# Patient Record
Sex: Female | Born: 1935 | Race: White | Hispanic: No | Smoking: Former smoker
Health system: Southern US, Community
[De-identification: ages and names within clinical notes are randomized; demographics above are authoritative.]

## PROBLEM LIST (undated history)

## (undated) DIAGNOSIS — M1990 Unspecified osteoarthritis, unspecified site: Secondary | ICD-10-CM

## (undated) DIAGNOSIS — E039 Hypothyroidism, unspecified: Secondary | ICD-10-CM

## (undated) DIAGNOSIS — G629 Polyneuropathy, unspecified: Secondary | ICD-10-CM

## (undated) DIAGNOSIS — M797 Fibromyalgia: Secondary | ICD-10-CM

## (undated) DIAGNOSIS — J40 Bronchitis, not specified as acute or chronic: Secondary | ICD-10-CM

## (undated) DIAGNOSIS — I10 Essential (primary) hypertension: Secondary | ICD-10-CM

## (undated) DIAGNOSIS — J449 Chronic obstructive pulmonary disease, unspecified: Secondary | ICD-10-CM

## (undated) DIAGNOSIS — B029 Zoster without complications: Secondary | ICD-10-CM

## (undated) HISTORY — PX: TONSILLECTOMY: SUR1361

## (undated) HISTORY — PX: TOTAL HIP ARTHROPLASTY: SHX124

## (undated) HISTORY — PX: BACK SURGERY: SHX140

## (undated) HISTORY — PX: NECK SURGERY: SHX720

## (undated) HISTORY — PX: PARTIAL HYSTERECTOMY: SHX80

---

## 2019-11-07 ENCOUNTER — Other Ambulatory Visit: Payer: Self-pay

## 2019-11-07 ENCOUNTER — Encounter (HOSPITAL_BASED_OUTPATIENT_CLINIC_OR_DEPARTMENT_OTHER): Payer: Self-pay | Admitting: Emergency Medicine

## 2019-11-07 ENCOUNTER — Inpatient Hospital Stay (HOSPITAL_BASED_OUTPATIENT_CLINIC_OR_DEPARTMENT_OTHER)
Admission: EM | Admit: 2019-11-07 | Discharge: 2019-11-10 | DRG: 291 | Disposition: A | Discharge status: Home Health | Payer: Medicare Other | Attending: Student | Admitting: Student

## 2019-11-07 ENCOUNTER — Emergency Department (HOSPITAL_BASED_OUTPATIENT_CLINIC_OR_DEPARTMENT_OTHER): Payer: Medicare Other

## 2019-11-07 DIAGNOSIS — R0601 Orthopnea: Secondary | ICD-10-CM

## 2019-11-07 DIAGNOSIS — R161 Splenomegaly, not elsewhere classified: Secondary | ICD-10-CM | POA: Diagnosis present

## 2019-11-07 DIAGNOSIS — Z87891 Personal history of nicotine dependence: Secondary | ICD-10-CM

## 2019-11-07 DIAGNOSIS — J9601 Acute respiratory failure with hypoxia: Secondary | ICD-10-CM | POA: Diagnosis present

## 2019-11-07 DIAGNOSIS — R0602 Shortness of breath: Secondary | ICD-10-CM | POA: Diagnosis not present

## 2019-11-07 DIAGNOSIS — Z96649 Presence of unspecified artificial hip joint: Secondary | ICD-10-CM | POA: Diagnosis present

## 2019-11-07 DIAGNOSIS — R600 Localized edema: Secondary | ICD-10-CM

## 2019-11-07 DIAGNOSIS — Z79891 Long term (current) use of opiate analgesic: Secondary | ICD-10-CM

## 2019-11-07 DIAGNOSIS — M1990 Unspecified osteoarthritis, unspecified site: Secondary | ICD-10-CM | POA: Diagnosis present

## 2019-11-07 DIAGNOSIS — R05 Cough: Secondary | ICD-10-CM

## 2019-11-07 DIAGNOSIS — I517 Cardiomegaly: Secondary | ICD-10-CM | POA: Diagnosis present

## 2019-11-07 DIAGNOSIS — G629 Polyneuropathy, unspecified: Secondary | ICD-10-CM | POA: Diagnosis present

## 2019-11-07 DIAGNOSIS — Z6841 Body Mass Index (BMI) 40.0 and over, adult: Secondary | ICD-10-CM

## 2019-11-07 DIAGNOSIS — M797 Fibromyalgia: Secondary | ICD-10-CM | POA: Diagnosis present

## 2019-11-07 DIAGNOSIS — I480 Paroxysmal atrial fibrillation: Secondary | ICD-10-CM | POA: Diagnosis present

## 2019-11-07 DIAGNOSIS — I5033 Acute on chronic diastolic (congestive) heart failure: Secondary | ICD-10-CM | POA: Diagnosis present

## 2019-11-07 DIAGNOSIS — M7989 Other specified soft tissue disorders: Secondary | ICD-10-CM | POA: Diagnosis present

## 2019-11-07 DIAGNOSIS — F329 Major depressive disorder, single episode, unspecified: Secondary | ICD-10-CM | POA: Diagnosis present

## 2019-11-07 DIAGNOSIS — R059 Cough, unspecified: Secondary | ICD-10-CM

## 2019-11-07 DIAGNOSIS — I110 Hypertensive heart disease with heart failure: Principal | ICD-10-CM | POA: Diagnosis present

## 2019-11-07 DIAGNOSIS — Z90711 Acquired absence of uterus with remaining cervical stump: Secondary | ICD-10-CM

## 2019-11-07 DIAGNOSIS — J441 Chronic obstructive pulmonary disease with (acute) exacerbation: Secondary | ICD-10-CM | POA: Diagnosis present

## 2019-11-07 DIAGNOSIS — R590 Localized enlarged lymph nodes: Secondary | ICD-10-CM | POA: Diagnosis present

## 2019-11-07 DIAGNOSIS — Z79890 Hormone replacement therapy (postmenopausal): Secondary | ICD-10-CM

## 2019-11-07 DIAGNOSIS — R609 Edema, unspecified: Secondary | ICD-10-CM

## 2019-11-07 DIAGNOSIS — N289 Disorder of kidney and ureter, unspecified: Secondary | ICD-10-CM

## 2019-11-07 DIAGNOSIS — Z79899 Other long term (current) drug therapy: Secondary | ICD-10-CM

## 2019-11-07 DIAGNOSIS — Z888 Allergy status to other drugs, medicaments and biological substances status: Secondary | ICD-10-CM

## 2019-11-07 DIAGNOSIS — F419 Anxiety disorder, unspecified: Secondary | ICD-10-CM | POA: Diagnosis present

## 2019-11-07 DIAGNOSIS — Z993 Dependence on wheelchair: Secondary | ICD-10-CM

## 2019-11-07 DIAGNOSIS — I4891 Unspecified atrial fibrillation: Secondary | ICD-10-CM

## 2019-11-07 DIAGNOSIS — L89312 Pressure ulcer of right buttock, stage 2: Secondary | ICD-10-CM | POA: Diagnosis present

## 2019-11-07 DIAGNOSIS — R5381 Other malaise: Secondary | ICD-10-CM | POA: Diagnosis present

## 2019-11-07 DIAGNOSIS — E039 Hypothyroidism, unspecified: Secondary | ICD-10-CM | POA: Diagnosis present

## 2019-11-07 DIAGNOSIS — L8990 Pressure ulcer of unspecified site, unspecified stage: Secondary | ICD-10-CM | POA: Diagnosis present

## 2019-11-07 DIAGNOSIS — D7389 Other diseases of spleen: Secondary | ICD-10-CM | POA: Diagnosis present

## 2019-11-07 DIAGNOSIS — R0902 Hypoxemia: Secondary | ICD-10-CM

## 2019-11-07 DIAGNOSIS — Z20822 Contact with and (suspected) exposure to covid-19: Secondary | ICD-10-CM | POA: Diagnosis present

## 2019-11-07 HISTORY — DX: Essential (primary) hypertension: I10

## 2019-11-07 HISTORY — DX: Unspecified osteoarthritis, unspecified site: M19.90

## 2019-11-07 HISTORY — DX: Polyneuropathy, unspecified: G62.9

## 2019-11-07 HISTORY — DX: Chronic obstructive pulmonary disease, unspecified: J44.9

## 2019-11-07 HISTORY — DX: Zoster without complications: B02.9

## 2019-11-07 HISTORY — DX: Fibromyalgia: M79.7

## 2019-11-07 HISTORY — DX: Bronchitis, not specified as acute or chronic: J40

## 2019-11-07 HISTORY — DX: Hypothyroidism, unspecified: E03.9

## 2019-11-07 LAB — COMPREHENSIVE METABOLIC PANEL
ALT: 15 U/L (ref 0–44)
AST: 18 U/L (ref 15–41)
Albumin: 3.5 g/dL (ref 3.5–5.0)
Alkaline Phosphatase: 49 U/L (ref 38–126)
Anion gap: 8 (ref 5–15)
BUN: 29 mg/dL — ABNORMAL HIGH (ref 8–23)
CO2: 25 mmol/L (ref 22–32)
Calcium: 8.7 mg/dL — ABNORMAL LOW (ref 8.9–10.3)
Chloride: 104 mmol/L (ref 98–111)
Creatinine, Ser: 1.22 mg/dL — ABNORMAL HIGH (ref 0.44–1.00)
GFR calc Af Amer: 46 mL/min — ABNORMAL LOW (ref >60)
GFR calc non Af Amer: 40 mL/min — ABNORMAL LOW (ref >60)
Glucose, Bld: 93 mg/dL (ref 70–99)
Potassium: 4.4 mmol/L (ref 3.5–5.1)
Sodium: 137 mmol/L (ref 135–145)
Total Bilirubin: 0.8 mg/dL (ref 0.3–1.2)
Total Protein: 7 g/dL (ref 6.5–8.1)

## 2019-11-07 LAB — CBC WITH DIFFERENTIAL/PLATELET
Abs Immature Granulocytes: 0.04 10*3/uL (ref 0.00–0.07)
Basophils Absolute: 0.1 10*3/uL (ref 0.0–0.1)
Basophils Relative: 1 %
Eosinophils Absolute: 0.3 10*3/uL (ref 0.0–0.5)
Eosinophils Relative: 3 %
HCT: 41.2 % (ref 36.0–46.0)
Hemoglobin: 13.3 g/dL (ref 12.0–15.0)
Immature Granulocytes: 1 %
Lymphocytes Relative: 18 %
Lymphs Abs: 1.4 10*3/uL (ref 0.7–4.0)
MCH: 29 pg (ref 26.0–34.0)
MCHC: 32.3 g/dL (ref 30.0–36.0)
MCV: 90 fL (ref 80.0–100.0)
Monocytes Absolute: 0.5 10*3/uL (ref 0.1–1.0)
Monocytes Relative: 7 %
Neutro Abs: 5.6 10*3/uL (ref 1.7–7.7)
Neutrophils Relative %: 70 %
Platelets: 217 10*3/uL (ref 150–400)
RBC: 4.58 MIL/uL (ref 3.87–5.11)
RDW: 12.8 % (ref 11.5–15.5)
WBC: 7.9 10*3/uL (ref 4.0–10.5)
nRBC: 0 % (ref 0.0–0.2)

## 2019-11-07 MED ORDER — ALBUTEROL SULFATE HFA 108 (90 BASE) MCG/ACT IN AERS
2.0000 | INHALATION_SPRAY | Freq: Once | RESPIRATORY_TRACT | Status: AC
  Administered 2019-11-07: 2 via RESPIRATORY_TRACT
  Filled 2019-11-07: qty 6.7

## 2019-11-07 MED ORDER — METHYLPREDNISOLONE SODIUM SUCC 125 MG IJ SOLR
125.0000 mg | Freq: Once | INTRAMUSCULAR | Status: AC
  Administered 2019-11-07: 125 mg via INTRAVENOUS
  Filled 2019-11-07: qty 2

## 2019-11-07 MED ORDER — IPRATROPIUM BROMIDE HFA 17 MCG/ACT IN AERS
2.0000 | INHALATION_SPRAY | Freq: Once | RESPIRATORY_TRACT | Status: AC
  Administered 2019-11-07: 2 via RESPIRATORY_TRACT
  Filled 2019-11-07: qty 12.9

## 2019-11-07 NOTE — ED Triage Notes (Signed)
PT brought in by [facility] with c/o hypoxia and shortness of breath  Pt was diagnosed with bronchitis and was prescribed a z pack that she completed today  Pt was having shortness of breath with pursed lip breathing at home with an O2 sat of 84% on room air  EMS gave one albuterol treatment and applied a NRBM at 6 liters  Sat came up to 98% EMS reports that her lungs are clear  Pt lives at home with her daughter

## 2019-11-07 NOTE — Progress Notes (Signed)
Patient SPO2 was on a non rebreather at 6 liters with a SPO2 of 100% upon arrival to ED.  Placed patient on 2 liter nasal cannula.  Patient's SPO2 remains between 97% and 98%.
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
## 2019-11-08 ENCOUNTER — Inpatient Hospital Stay (HOSPITAL_COMMUNITY): Payer: Medicare Other

## 2019-11-08 ENCOUNTER — Encounter (HOSPITAL_COMMUNITY): Payer: Self-pay | Admitting: Internal Medicine

## 2019-11-08 ENCOUNTER — Emergency Department (HOSPITAL_BASED_OUTPATIENT_CLINIC_OR_DEPARTMENT_OTHER): Payer: Medicare Other

## 2019-11-08 DIAGNOSIS — I5031 Acute diastolic (congestive) heart failure: Secondary | ICD-10-CM | POA: Diagnosis not present

## 2019-11-08 DIAGNOSIS — D7389 Other diseases of spleen: Secondary | ICD-10-CM | POA: Diagnosis present

## 2019-11-08 DIAGNOSIS — Z96649 Presence of unspecified artificial hip joint: Secondary | ICD-10-CM | POA: Diagnosis present

## 2019-11-08 DIAGNOSIS — L8990 Pressure ulcer of unspecified site, unspecified stage: Secondary | ICD-10-CM

## 2019-11-08 DIAGNOSIS — R590 Localized enlarged lymph nodes: Secondary | ICD-10-CM | POA: Diagnosis present

## 2019-11-08 DIAGNOSIS — R5381 Other malaise: Secondary | ICD-10-CM | POA: Diagnosis present

## 2019-11-08 DIAGNOSIS — J441 Chronic obstructive pulmonary disease with (acute) exacerbation: Secondary | ICD-10-CM | POA: Diagnosis present

## 2019-11-08 DIAGNOSIS — L89312 Pressure ulcer of right buttock, stage 2: Secondary | ICD-10-CM | POA: Diagnosis present

## 2019-11-08 DIAGNOSIS — I5033 Acute on chronic diastolic (congestive) heart failure: Secondary | ICD-10-CM

## 2019-11-08 DIAGNOSIS — I480 Paroxysmal atrial fibrillation: Secondary | ICD-10-CM | POA: Diagnosis present

## 2019-11-08 DIAGNOSIS — Z993 Dependence on wheelchair: Secondary | ICD-10-CM

## 2019-11-08 DIAGNOSIS — N289 Disorder of kidney and ureter, unspecified: Secondary | ICD-10-CM | POA: Diagnosis present

## 2019-11-08 DIAGNOSIS — I517 Cardiomegaly: Secondary | ICD-10-CM | POA: Diagnosis not present

## 2019-11-08 DIAGNOSIS — M7989 Other specified soft tissue disorders: Secondary | ICD-10-CM | POA: Diagnosis not present

## 2019-11-08 DIAGNOSIS — I10 Essential (primary) hypertension: Secondary | ICD-10-CM | POA: Diagnosis not present

## 2019-11-08 DIAGNOSIS — F329 Major depressive disorder, single episode, unspecified: Secondary | ICD-10-CM | POA: Diagnosis present

## 2019-11-08 DIAGNOSIS — Z888 Allergy status to other drugs, medicaments and biological substances status: Secondary | ICD-10-CM | POA: Diagnosis not present

## 2019-11-08 DIAGNOSIS — F419 Anxiety disorder, unspecified: Secondary | ICD-10-CM | POA: Diagnosis present

## 2019-11-08 DIAGNOSIS — M797 Fibromyalgia: Secondary | ICD-10-CM | POA: Diagnosis present

## 2019-11-08 DIAGNOSIS — Z20822 Contact with and (suspected) exposure to covid-19: Secondary | ICD-10-CM | POA: Diagnosis present

## 2019-11-08 DIAGNOSIS — I4891 Unspecified atrial fibrillation: Secondary | ICD-10-CM | POA: Diagnosis not present

## 2019-11-08 DIAGNOSIS — J9601 Acute respiratory failure with hypoxia: Secondary | ICD-10-CM | POA: Diagnosis present

## 2019-11-08 DIAGNOSIS — G629 Polyneuropathy, unspecified: Secondary | ICD-10-CM | POA: Diagnosis present

## 2019-11-08 DIAGNOSIS — E039 Hypothyroidism, unspecified: Secondary | ICD-10-CM | POA: Diagnosis present

## 2019-11-08 DIAGNOSIS — R0602 Shortness of breath: Secondary | ICD-10-CM | POA: Diagnosis present

## 2019-11-08 DIAGNOSIS — M1990 Unspecified osteoarthritis, unspecified site: Secondary | ICD-10-CM | POA: Diagnosis present

## 2019-11-08 DIAGNOSIS — Z90711 Acquired absence of uterus with remaining cervical stump: Secondary | ICD-10-CM | POA: Diagnosis not present

## 2019-11-08 DIAGNOSIS — Z87891 Personal history of nicotine dependence: Secondary | ICD-10-CM | POA: Diagnosis not present

## 2019-11-08 DIAGNOSIS — R161 Splenomegaly, not elsewhere classified: Secondary | ICD-10-CM | POA: Diagnosis present

## 2019-11-08 DIAGNOSIS — I110 Hypertensive heart disease with heart failure: Secondary | ICD-10-CM | POA: Diagnosis present

## 2019-11-08 DIAGNOSIS — Z6841 Body Mass Index (BMI) 40.0 and over, adult: Secondary | ICD-10-CM | POA: Diagnosis not present

## 2019-11-08 LAB — ECHOCARDIOGRAM COMPLETE: Weight: 4000 oz

## 2019-11-08 LAB — MAGNESIUM: Magnesium: 2 mg/dL (ref 1.7–2.4)

## 2019-11-08 LAB — BRAIN NATRIURETIC PEPTIDE: B Natriuretic Peptide: 81.1 pg/mL (ref 0.0–100.0)

## 2019-11-08 LAB — GLUCOSE, CAPILLARY: Glucose-Capillary: 142 mg/dL — ABNORMAL HIGH (ref 70–99)

## 2019-11-08 LAB — TROPONIN I (HIGH SENSITIVITY)
Troponin I (High Sensitivity): 9 ng/L (ref <18)
Troponin I (High Sensitivity): 12 ng/L (ref <18)

## 2019-11-08 LAB — TSH: TSH: 3.268 u[IU]/mL (ref 0.350–4.500)

## 2019-11-08 LAB — D-DIMER, QUANTITATIVE: D-Dimer, Quant: 1.9 ug/mL-FEU — ABNORMAL HIGH (ref 0.00–0.50)

## 2019-11-08 LAB — RESPIRATORY PANEL BY RT PCR (FLU A&B, COVID)
Influenza A by PCR: NEGATIVE
Influenza B by PCR: NEGATIVE
SARS Coronavirus 2 by RT PCR: NEGATIVE

## 2019-11-08 LAB — LACTIC ACID, PLASMA: Lactic Acid, Venous: 1 mmol/L (ref 0.5–1.9)

## 2019-11-08 LAB — PROCALCITONIN: Procalcitonin: <0.1 ng/mL

## 2019-11-08 MED ORDER — ACETAMINOPHEN 325 MG PO TABS
650.0000 mg | ORAL_TABLET | ORAL | Status: DC | PRN
  Administered 2019-11-10: 650 mg via ORAL
  Filled 2019-11-08: qty 2

## 2019-11-08 MED ORDER — CALCIUM CARBONATE-VITAMIN D 500-200 MG-UNIT PO TABS
1.0000 | ORAL_TABLET | Freq: Every day | ORAL | Status: DC
  Administered 2019-11-08 – 2019-11-10 (×3): 1 via ORAL
  Filled 2019-11-08 (×3): qty 1

## 2019-11-08 MED ORDER — ONDANSETRON HCL 4 MG/2ML IJ SOLN: 4.0000 mg | Freq: Four times a day (QID) | INTRAMUSCULAR | Status: DC | PRN

## 2019-11-08 MED ORDER — PANTOPRAZOLE SODIUM 40 MG PO TBEC
40.0000 mg | DELAYED_RELEASE_TABLET | Freq: Every day | ORAL | Status: DC
  Administered 2019-11-08 – 2019-11-10 (×3): 40 mg via ORAL
  Filled 2019-11-08 (×3): qty 1

## 2019-11-08 MED ORDER — IPRATROPIUM BROMIDE 0.02 % IN SOLN
0.5000 mg | Freq: Three times a day (TID) | RESPIRATORY_TRACT | Status: DC
  Administered 2019-11-08 – 2019-11-10 (×6): 0.5 mg via RESPIRATORY_TRACT
  Filled 2019-11-08 (×6): qty 2.5

## 2019-11-08 MED ORDER — GABAPENTIN 300 MG PO CAPS
300.0000 mg | ORAL_CAPSULE | Freq: Three times a day (TID) | ORAL | Status: DC
  Administered 2019-11-08 – 2019-11-10 (×6): 300 mg via ORAL
  Filled 2019-11-08 (×6): qty 3

## 2019-11-08 MED ORDER — CITALOPRAM HYDROBROMIDE 20 MG PO TABS
40.0000 mg | ORAL_TABLET | Freq: Every day | ORAL | Status: DC
  Administered 2019-11-08 – 2019-11-10 (×3): 40 mg via ORAL
  Filled 2019-11-08 (×3): qty 2

## 2019-11-08 MED ORDER — LORATADINE 10 MG PO TABS
10.0000 mg | ORAL_TABLET | Freq: Every day | ORAL | Status: DC
  Administered 2019-11-08 – 2019-11-10 (×3): 10 mg via ORAL
  Filled 2019-11-08 (×3): qty 1

## 2019-11-08 MED ORDER — ALBUTEROL SULFATE HFA 108 (90 BASE) MCG/ACT IN AERS
2.0000 | INHALATION_SPRAY | RESPIRATORY_TRACT | Status: DC | PRN
  Filled 2019-11-08: qty 6.7

## 2019-11-08 MED ORDER — ENOXAPARIN SODIUM 120 MG/0.8ML ~~LOC~~ SOLN
1.0000 mg/kg | Freq: Two times a day (BID) | SUBCUTANEOUS | Status: DC
  Administered 2019-11-08 – 2019-11-09 (×3): 115 mg via SUBCUTANEOUS
  Filled 2019-11-08 (×3): qty 0.8

## 2019-11-08 MED ORDER — INFLUENZA VAC A&B SA ADJ QUAD 0.5 ML IM PRSY
0.5000 mL | PREFILLED_SYRINGE | INTRAMUSCULAR | Status: DC
  Filled 2019-11-08: qty 0.5

## 2019-11-08 MED ORDER — TRAMADOL HCL 50 MG PO TABS
50.0000 mg | ORAL_TABLET | Freq: Three times a day (TID) | ORAL | Status: DC | PRN
  Administered 2019-11-08 – 2019-11-10 (×4): 50 mg via ORAL
  Filled 2019-11-08 (×4): qty 1

## 2019-11-08 MED ORDER — PREDNISONE 20 MG PO TABS
40.0000 mg | ORAL_TABLET | Freq: Every day | ORAL | Status: DC
  Administered 2019-11-09 – 2019-11-10 (×2): 40 mg via ORAL
  Filled 2019-11-08 (×2): qty 2

## 2019-11-08 MED ORDER — PNEUMOCOCCAL VAC POLYVALENT 25 MCG/0.5ML IJ INJ
0.5000 mL | INJECTION | INTRAMUSCULAR | Status: DC
  Filled 2019-11-08 (×2): qty 0.5

## 2019-11-08 MED ORDER — DILTIAZEM HCL 60 MG PO TABS
60.0000 mg | ORAL_TABLET | Freq: Three times a day (TID) | ORAL | Status: DC
  Administered 2019-11-08 – 2019-11-09 (×2): 60 mg via ORAL
  Filled 2019-11-08 (×2): qty 1

## 2019-11-08 MED ORDER — LEVOTHYROXINE SODIUM 50 MCG PO TABS
50.0000 ug | ORAL_TABLET | Freq: Every day | ORAL | Status: DC
  Administered 2019-11-09 – 2019-11-10 (×2): 50 ug via ORAL
  Filled 2019-11-08 (×2): qty 1

## 2019-11-08 MED ORDER — IOHEXOL 350 MG/ML SOLN
80.0000 mL | Freq: Once | INTRAVENOUS | Status: AC | PRN
  Administered 2019-11-08: 80 mL via INTRAVENOUS

## 2019-11-08 MED ORDER — MUPIROCIN 2 % EX OINT
1.0000 "application " | TOPICAL_OINTMENT | Freq: Three times a day (TID) | CUTANEOUS | Status: DC
  Administered 2019-11-08 – 2019-11-10 (×5): 1 via TOPICAL
  Filled 2019-11-08 (×2): qty 22

## 2019-11-08 MED ORDER — LEVALBUTEROL HCL 0.63 MG/3ML IN NEBU
0.6300 mg | INHALATION_SOLUTION | Freq: Three times a day (TID) | RESPIRATORY_TRACT | Status: DC
  Administered 2019-11-08 – 2019-11-10 (×6): 0.63 mg via RESPIRATORY_TRACT
  Filled 2019-11-08 (×6): qty 3

## 2019-11-08 MED ORDER — FUROSEMIDE 10 MG/ML IJ SOLN
40.0000 mg | Freq: Two times a day (BID) | INTRAMUSCULAR | Status: DC
  Administered 2019-11-08: 40 mg via INTRAVENOUS
  Filled 2019-11-08: qty 4

## 2019-11-08 MED ORDER — SODIUM CHLORIDE 0.9% FLUSH
3.0000 mL | Freq: Two times a day (BID) | INTRAVENOUS | Status: DC
  Administered 2019-11-08 – 2019-11-10 (×5): 3 mL via INTRAVENOUS

## 2019-11-08 MED ORDER — SODIUM CHLORIDE 0.9 % IV SOLN: 250.0000 mL | INTRAVENOUS | Status: DC | PRN

## 2019-11-08 MED ORDER — GUAIFENESIN ER 600 MG PO TB12
600.0000 mg | ORAL_TABLET | Freq: Two times a day (BID) | ORAL | Status: DC
  Administered 2019-11-08 – 2019-11-10 (×5): 600 mg via ORAL
  Filled 2019-11-08 (×5): qty 1

## 2019-11-08 MED ORDER — ALBUTEROL SULFATE (2.5 MG/3ML) 0.083% IN NEBU
2.5000 mg | INHALATION_SOLUTION | Freq: Four times a day (QID) | RESPIRATORY_TRACT | Status: DC | PRN
  Administered 2019-11-08: 2.5 mg via RESPIRATORY_TRACT
  Filled 2019-11-08: qty 3

## 2019-11-08 MED ORDER — SODIUM CHLORIDE 0.9% FLUSH
3.0000 mL | INTRAVENOUS | Status: DC | PRN
  Administered 2019-11-08: 3 mL via INTRAVENOUS

## 2019-11-08 NOTE — Progress Notes (Signed)
ANTICOAGULATION CONSULT NOTE - Initial Consult  Pharmacy Consult for enoxaparin Indication: atrial fibrillation  Allergies  Allergen Reactions  . Lisinopril Swelling  . Phenobarbital     Patient Measurements: Weight: 250 lb (113.4 kg)  Vital Signs: Temp: 97.9 F (36.6 C) (02/11 0822) Temp Source: Oral (02/11 0822) BP: 143/70 (02/11 0822) Pulse Rate: 86 (02/11 0822)  Labs: Recent Labs    11/07/19 2320 11/08/19 0837  HGB 13.3  --   HCT 41.2  --   PLT 217  --   CREATININE 1.22*  --   TROPONINIHS 9 12    CrCl cannot be calculated (Unknown ideal weight.).  Assessment: 86 yo f presenting with shortness of breath  Found to be in afib - new onset no anticoag pta  Cbc stable  CHA2DS2/VAS Stroke Risk Points  Current as of 29 minutes ago     3 >= 2 Points: High Risk  1 - 1.99 Points: Medium Risk  0 Points: Low Risk    The patient's score has not changed in the past year.: No Change     Details    This score determines the patient's risk of having a stroke if the  patient has atrial fibrillation.       Points Metrics  0 Has Congestive Heart Failure:  No    Current as of 29 minutes ago  0 Has Vascular Disease:  No    Current as of 29 minutes ago  0 Has Hypertension:  No    Current as of 29 minutes ago  2 Age:  60    Current as of 29 minutes ago  0 Has Diabetes:  No    Current as of 29 minutes ago  0 Had Stroke:  No  Had TIA:  No  Had thromboembolism:  No    Current as of 29 minutes ago  1 Female:  Yes    Current as of 29 minutes ago   Plan:  enox 1 mg/kg q12h F/u plans for oral ac  Elmer Sow, PharmD, BCPS, BCCCP Clinical Pharmacist [phone]  Please check AMION for all [facility] numbers  11/08/2019 10:28 AM

## 2019-11-08 NOTE — Plan of Care (Signed)

## 2019-11-08 NOTE — Consult Note (Addendum)
Cardiology Consultation:   Patient ID: Angela Beasley MRN: [id number]; DOB: April 06, 1930  Admit date: 11/07/2019 Date of Consult: 11/08/2019  Primary Care Provider: Karle Plumber, MD Primary Cardiologist: New to [facility]   Patient Profile:   Angela Beasley is a 86 y.o. female with a hx of hypertension, COPD with chronic bronchitis, prior smoker (quit 1986), hypothyroidism, fibromyalgia and wheelchair-bound admitted with acute hypoxic respiratory failure secondary to COPD exacerbation/pulmonary edema who is being seen today for the evaluation of new onset atrial fibrillation at the request of Dr. Katrinka Blazing.  Remote stress test for preoperative clearance was low risk.  History of Present Illness:   Angela Beasley most recently diagnosed with acute bronchitis who completed Z-Pak course over the weekend.  Yesterday family noted hypoxia on pulse ox and wheezing and EMS was called.  EMS noted hypoxic at oxygen saturation of 70s and atrial fibrillation.  D-dimer 1.9.  BNP 81.  High-sensitivity troponin normal.  Creatinine 1.22.  CT angiogram of chest without PE however showed pulmonary edema/possible infection, bilateral pleural effusion, mediastinal and hilar adenopathy with spleen mass.  Given nebulizer and IV steroid.  On Lovenox for afib. Pending echo.   Denies fever, chills or Covid exposure.  No chest pain, shortness of breath, palpitation, dizziness, orthopnea, PND, syncope or lower extremity edema.  Heart Pathway Score:     Past Medical History:  Diagnosis Date  . Arthritis   . Bronchitis   . COPD (chronic obstructive pulmonary disease) (HCC)   . Fibromyalgia   . Hypertension   . Hypothyroidism   . Neuropathy   . Shingles     Past Surgical History:  Procedure Laterality Date  . BACK SURGERY    . CESAREAN SECTION    . NECK SURGERY    . PARTIAL HYSTERECTOMY    . TONSILLECTOMY    . TOTAL HIP ARTHROPLASTY      Inpatient Medications: Scheduled Meds: . Calcium Carb-Cholecalciferol  1  tablet Oral Daily  . citalopram  40 mg Oral Daily  . enoxaparin (LOVENOX) injection  1 mg/kg Subcutaneous Q12H  . furosemide  40 mg Intravenous BID  . gabapentin  300 mg Oral TID  . guaiFENesin  600 mg Oral BID  . [START ON 11/09/2019] influenza vaccine adjuvanted  0.5 mL Intramuscular Tomorrow-1000  . ipratropium  0.5 mg Nebulization TID  . levalbuterol  0.63 mg Nebulization TID  . levothyroxine  50 mcg Oral Daily  . loratadine  10 mg Oral Daily  . mupirocin ointment  1 application Topical TID  . [START ON 11/09/2019] pneumococcal 23 valent vaccine  0.5 mL Intramuscular Tomorrow-1000  . predniSONE  40 mg Oral Q breakfast  . sodium chloride flush  3 mL Intravenous Q12H   Continuous Infusions: . sodium chloride     PRN Meds: sodium chloride, acetaminophen, albuterol, ondansetron (ZOFRAN) IV, sodium chloride flush, traMADol  Allergies:    Allergies  Allergen Reactions  . Lisinopril Swelling  . Phenobarbital     Social History:   Social History   Socioeconomic History  . Marital status: Divorced    Spouse name: Not on file  . Number of children: Not on file  . Years of education: Not on file  . Highest education level: Not on file  Occupational History  . Not on file  Tobacco Use  . Smoking status: Former Games developer  . Smokeless tobacco: Never Used  Substance and Sexual Activity  . Alcohol use: Never  . Drug use: Never  . Sexual activity: Not on  file  Other Topics Concern  . Not on file  Social History Narrative  . Not on file   Social Determinants of Health   Financial Resource Strain:   . Difficulty of Paying Living Expenses: Not on file  Food Insecurity:   . Worried About Programme researcher, broadcasting/film/video in the Last Year: Not on file  . Ran Out of Food in the Last Year: Not on file  Transportation Needs:   . Lack of Transportation (Medical): Not on file  . Lack of Transportation (Non-Medical): Not on file  Physical Activity:   . Days of Exercise per Week: Not on file  .  Minutes of Exercise per Session: Not on file  Stress:   . Feeling of Stress : Not on file  Social Connections:   . Frequency of Communication with Friends and Family: Not on file  . Frequency of Social Gatherings with Friends and Family: Not on file  . Attends Religious Services: Not on file  . Active Member of Clubs or Organizations: Not on file  . Attends Banker Meetings: Not on file  . Marital Status: Not on file  Intimate Partner Violence:   . Fear of Current or Ex-Partner: Not on file  . Emotionally Abused: Not on file  . Physically Abused: Not on file  . Sexually Abused: Not on file    Family History:    history of extra beat to mother  ROS:  Please see the history of present illness.  All other ROS reviewed and negative.     Physical Exam/Data:   Vitals:   11/08/19 0434 11/08/19 0500 11/08/19 0623 11/08/19 0822  BP:  123/88 (!) 141/67 (!) 143/70  Pulse:  (!) 119 (!) 113 86  Resp:  14 20 13   Temp:   98.5 F (36.9 C) 97.9 F (36.6 C)  TempSrc:   Oral Oral  SpO2:  96% 96% 96%  Weight: 113.4 kg      No intake or output data in the 24 hours ending 11/08/19 1117 Last 3 Weights 11/08/2019  Weight (lbs) 250 lb  Weight (kg) 113.399 kg     There is no height or weight on file to calculate BMI.  General:  Well nourished, well developed, in no acute distress HEENT: normal Lymph: no adenopathy Neck: no JVD Endocrine:  No thryomegaly Vascular: No carotid bruits; FA pulses 2+ bilaterally without bruits  Cardiac:  normal S1, S2; irregular tachycardic; no murmur  Lungs: Diminished breath sound with scattered wheezing  abd: soft, nontender, no hepatomegaly  Ext: no edema Musculoskeletal:  No deformities, BUE and BLE strength normal and equal Skin: warm and dry  Neuro:  CNs 2-12 intact, no focal abnormalities noted Psych:  Normal affect   EKG:  The EKG was personally reviewed and demonstrates: Atrial fibrillation at rate of 104 bpm Telemetry:  Telemetry was  personally reviewed and demonstrates: Atrial fibrillation at rate 90-1 120s  Relevant CV Studies:  Pending echocardiogram  Laboratory Data:  High Sensitivity Troponin:   Recent Labs  Lab 11/07/19 2320 11/08/19 0837  TROPONINIHS 9 12     Chemistry Recent Labs  Lab 11/07/19 2320  NA 137  K 4.4  CL 104  CO2 25  GLUCOSE 93  BUN 29*  CREATININE 1.22*  CALCIUM 8.7*  GFRNONAA 40*  GFRAA 46*  ANIONGAP 8    Recent Labs  Lab 11/07/19 2320  PROT 7.0  ALBUMIN 3.5  AST 18  ALT 15  ALKPHOS 49  BILITOT  0.8   Hematology Recent Labs  Lab 11/07/19 2320  WBC 7.9  RBC 4.58  HGB 13.3  HCT 41.2  MCV 90.0  MCH 29.0  MCHC 32.3  RDW 12.8  PLT 217   BNP Recent Labs  Lab 11/07/19 2320  BNP 81.1    DDimer  Recent Labs  Lab 11/07/19 2320  DDIMER 1.90*     Radiology/Studies:  CT Angio Chest PE W and/or Wo Contrast  Result Date: 11/08/2019 CLINICAL DATA:  Shortness of breath, elevated D-dimer EXAM: CT ANGIOGRAPHY CHEST WITH CONTRAST TECHNIQUE: Multidetector CT imaging of the chest was performed using the standard protocol during bolus administration of intravenous contrast. Multiplanar CT image reconstructions and MIPs were obtained to evaluate the vascular anatomy. CONTRAST:  33mL OMNIPAQUE IOHEXOL 350 MG/ML SOLN COMPARISON:  Chest radiograph 11/07/2019 FINDINGS: Cardiovascular: Satisfactory opacification the pulmonary arteries to the segmental level. No pulmonary artery filling defects are identified. Central pulmonary arteries are borderline enlarged but this finding is favored to reflect chronic pulmonary artery hypertension in the absence of filling defects. Normal cardiac size. Coronary artery calcifications are present. Dense calcification of the mitral annulus. No pericardial effusion. Normal physiologic fluid in the pericardial recesses. Major venous structures are unremarkable. Mediastinum/Nodes: There is extensive mediastinal and hilar adenopathy. A larger nodal  conglomerate is seen in the subcarinal space measuring up to 23 mm (4/52). Enlarged 12 mm right hilar node (4/59) as well as several borderline enlarged AP window lymph nodes including a 16 mm node (4/45). No acute abnormality of the trachea. Mild nonspecific thickening of the thoracic esophagus. Lungs/Pleura: There is extensive interlobular septal thickening with some interspersed areas of mosaic attenuation and patchy ground-glass. Few multifocal more consolidative airspace opacities are noted as well. Largest focal consolidation seen in right lung base (4/81) measuring up to 2 cm in size. Small bilateral effusions, right slightly greater than left with adjacent passive atelectasis. Upper Abdomen: Heterogeneous lesion seen in the spleen measuring up to 3.8 cm in size. Some irregular internal calcification is noted. Slight left lobe hypertrophy of the liver. Upper abdomen is otherwise unremarkable. Circumferential body wall edema is noted. Musculoskeletal: Multilevel degenerative changes are present in the imaged portions of the spine. No suspicious chest wall lesions. Review of the MIP images confirms the above findings. IMPRESSION: 1. No evidence of acute pulmonary embolism. 2. Central pulmonary arteries are borderline enlarged but this finding is favored to reflect chronic pulmonary artery hypertension in the absence of filling defects. 3. Extensive interlobular septal thickening with some interspersed areas of mosaic attenuation and patchy ground-glass. Few multifocal more consolidative airspace opacities are noted as well. These findings are favored to represent pulmonary edema, however there are features suggesting superimposed infection as well with a more consolidated 2 cm opacity in the right lung base. Consider 6-8 week follow-up imaging to assess for resolution. 4. Small bilateral pleural effusions, right slightly greater than left. Additional body wall edema may reflect early anasarca. 5. Extensive  mediastinal and hilar adenopathy. Differential could include either reactive or edematous nodes given setting of edema and likely infection however given the splenic mass detailed below, malignancy is not fully excluded. Could also be re-evaluated on follow-up imaging. 6. Heterogeneous lesion in the spleen measuring up to 3.8 cm in size with irregular internal calcification. This finding is nonspecific and could represent a benign lesion such as hemangioma, however other etiologies are not excluded. Consider further evaluation dedicated splenic MRI with contrast on a nonemergent outpatient basis for best imaging quality. This  recommendation follows ACR consensus guidelines: White Paper of the ACR Incidental Findings Committee II on Splenic and Nodal Findings. J Am Coll Radiol 2013;10:789-794. 7. Mild nonspecific thickening of the thoracic esophagus. Correlate for features of esophagitis. 8. Slight left lobe hypertrophy of the liver. Correlate with any history of liver disease or risk factors for cirrhosis. These results were called by telephone at the time of interpretation on 11/08/2019 at 1:26 am to provider [facility] , who verbally acknowledged these results. Electronically Signed   By: Lovena Le M.D.   On: 11/08/2019 01:26   DG Chest Portable 1 View  Result Date: 11/07/2019 CLINICAL DATA:  Shortness of breath cough EXAM: PORTABLE CHEST 1 VIEW COMPARISON:  July 30, 2016 FINDINGS: The heart size and mediastinal contours are unchanged with cardiomegaly. Aortic knob calcifications. Mildly increased interstitial markings are seen throughout both lungs, predominantly at both lung bases. There is prominence of the central pulmonary vasculature. No pleural effusion is seen. No acute osseous abnormality. IMPRESSION: Diffusely increased interstitial opacities, predominantly at both lung bases, likely due to interstitial edema. Mild cardiomegaly Electronically Signed   By: Prudencio Pair M.D.   On: 11/07/2019  23:06   { Assessment and Plan:   1. New onset atrial fibrillation with rapid ventricular rate in setting of COPD exacerbation  -Patient is unaware of arrhythmia.  Asymptomatic.  This could be due to COPD exacerbation.  CHA2DS2-VASc 2 score of 4 for age sex and hypertension.  Currently on Lovenox for anticoagulation (got dose this morning)>> spoke with attending Dr. Tamala Julian who said primary team may start work-up tomorrow for spleen mass.  Changed to Eliquis 5 mg twice daily if no further work-up planned at inpatient. Add Cardizem 60mg  q 8 hours for rate control.  Pending echocardiogram. Normal TSH.   2.  Acute hypoxic respiratory failure secondary to pulmonary edema/COPD exacerbation -CT of chest as above -On nebulizer treatment with IV steroids -Recently completed Z-Pak course -Per primary team - On Lasix>> Will not need long term. BNP normal. On signs of volume overload by exam.   3.  Hypertension -Blood pressure relatively stable -Home amlodipine on hold>>> will switch to Cardizem for rate control  4.  Hypothyroidism -TSH normal  5. Spleen mass/mediasinal/hilar adenopathy - Per primary team  -Change anticoagulation to Eliquis when no further work-up planned  For questions or updates, please contact [place] Please consult [email or web address] for contact info under   Jarrett Soho, PA  11/08/2019 11:17 AM   Patient seen and examined.  Agree with above documentation.  Angela Beasley is an 86 year old female with history of hypertension, COPD, fibromyalgia, hypothyroidism who is admitted with acute hypoxic respiratory failure due to COPD exacerbation/pulmonary edema who we are seeing today for evaluation of atrial fibrillation at the request of Dr. Tamala Julian.  Patient was recently diagnosed with acute bronchitis and started on Z-Pak.  Yesterday she was found to be hypoxic on pulse ox by her family and EMS was called.  On arrival, her oxygen saturations were in the 70s and she was  noted to be in atrial fibrillation.  Labs notable for normal high-sensitivity troponin, BNP 81, creatinine 1.22.  D-dimer elevated at 1.9, CTA chest was done which showed no evidence of PE but did show pulmonary edema/possible infection, as well as spleen mass.  Being treated for COPD exacerbation with IV steroids.  EKG on admission personally reviewed, shows sinus rhythm, rate 76, low voltage.  EKG from today personally reviewed and shows atrial fibrillation, rate 104,  low voltage.  From review of telemetry shows patient was initially in sinus rhythm in the 80s, currently in atrial fibrillation with rates 100s to 110s. On exam, patient is alert and oriented, irregular rate, tachycardic, no murmurs, diminished breath sounds, no LE edema or JVD.  She  denies any palpitations or chest pain.  For her atrial fibrillation, will start on diltiazem 60 mg every 8 hours for rate control.  On therapeutic Lovenox for anticoagulation, will transition to Eliquis if no further invasive procedures planned.  Little Ishikawa, MD

## 2019-11-08 NOTE — Progress Notes (Signed)
Patient's SPO2 is falling below 90%.  Increased patient's from 2 liters to 3 liters on her nasal cannula.  Patient's SPO2 increased to 95%.

## 2019-11-08 NOTE — Progress Notes (Signed)
Bilateral lower extremity venous duplex has been completed. Preliminary results can be found in CV Proc through chart review.   11/08/19 10:54 AM Olen Cordial RVT

## 2019-11-08 NOTE — Progress Notes (Signed)
  Echocardiogram 2D Echocardiogram has been performed.  Angela Beasley 11/08/2019, 12:31 PM

## 2019-11-08 NOTE — ED Provider Notes (Signed)
Nursing notes and vitals signs, including pulse oximetry, reviewed.  Summary of this visit's results, reviewed by myself:  EKG:  EKG Interpretation  Date/Time:  Wednesday November 07 2019 23:56:07 EST Ventricular Rate:  76 PR Interval:    QRS Duration: 93 QT Interval:  379 QTC Calculation: 427 R Axis:   63 Text Interpretation: Sinus rhythm Low voltage, extremity and precordial leads no prior ECG for comparison. No STEMI Confirmed by Antony Blackbird [phone]) on 11/08/2019 12:17:16 AM       Labs:  Results for orders placed or performed during the hospital encounter of 11/07/19 (from the past 24 hour(s))  D-dimer, quantitative (not at [facility])     Status: Abnormal   Collection Time: 11/07/19 11:20 PM  Result Value Ref Range   D-Dimer, Quant 1.90 (H) 0.00 - 0.50 ug/mL-FEU  CBC with Differential     Status: None   Collection Time: 11/07/19 11:20 PM  Result Value Ref Range   WBC 7.9 4.0 - 10.5 K/uL   RBC 4.58 3.87 - 5.11 MIL/uL   Hemoglobin 13.3 12.0 - 15.0 g/dL   HCT 41.2 36.0 - 46.0 %   MCV 90.0 80.0 - 100.0 fL   MCH 29.0 26.0 - 34.0 pg   MCHC 32.3 30.0 - 36.0 g/dL   RDW 12.8 11.5 - 15.5 %   Platelets 217 150 - 400 K/uL   nRBC 0.0 0.0 - 0.2 %   Neutrophils Relative % 70 %   Neutro Abs 5.6 1.7 - 7.7 K/uL   Lymphocytes Relative 18 %   Lymphs Abs 1.4 0.7 - 4.0 K/uL   Monocytes Relative 7 %   Monocytes Absolute 0.5 0.1 - 1.0 K/uL   Eosinophils Relative 3 %   Eosinophils Absolute 0.3 0.0 - 0.5 K/uL   Basophils Relative 1 %   Basophils Absolute 0.1 0.0 - 0.1 K/uL   Immature Granulocytes 1 %   Abs Immature Granulocytes 0.04 0.00 - 0.07 K/uL  Comprehensive metabolic panel     Status: Abnormal   Collection Time: 11/07/19 11:20 PM  Result Value Ref Range   Sodium 137 135 - 145 mmol/L   Potassium 4.4 3.5 - 5.1 mmol/L   Chloride 104 98 - 111 mmol/L   CO2 25 22 - 32 mmol/L   Glucose, Bld 93 70 - 99 mg/dL   BUN 29 (H) 8 - 23 mg/dL   Creatinine, Ser 1.22 (H) 0.44 - 1.00 mg/dL   Calcium 8.7 (L) 8.9 - 10.3 mg/dL   Total Protein 7.0 6.5 - 8.1 g/dL   Albumin 3.5 3.5 - 5.0 g/dL   AST 18 15 - 41 U/L   ALT 15 0 - 44 U/L   Alkaline Phosphatase 49 38 - 126 U/L   Total Bilirubin 0.8 0.3 - 1.2 mg/dL   GFR calc non Af Amer 40 (L) >60 mL/min   GFR calc Af Amer 46 (L) >60 mL/min   Anion gap 8 5 - 15  Brain natriuretic peptide     Status: None   Collection Time: 11/07/19 11:20 PM  Result Value Ref Range   B Natriuretic Peptide 81.1 0.0 - 100.0 pg/mL  Troponin I (High Sensitivity)     Status: None   Collection Time: 11/07/19 11:20 PM  Result Value Ref Range   Troponin I (High Sensitivity) 9 <18 ng/L  Lactic acid, plasma     Status: None   Collection Time: 11/07/19 11:45 PM  Result Value Ref Range   Lactic Acid, Venous 1.0 0.5 -  1.9 mmol/L  Respiratory Panel by RT PCR (Flu A&B, Covid) - Nasopharyngeal Swab     Status: None   Collection Time: 11/08/19  1:00 AM   Specimen: Nasopharyngeal Swab  Result Value Ref Range   SARS Coronavirus 2 by RT PCR NEGATIVE NEGATIVE   Influenza A by PCR NEGATIVE NEGATIVE   Influenza B by PCR NEGATIVE NEGATIVE    Imaging Studies: CT Angio Chest PE W and/or Wo Contrast  Result Date: 11/08/2019 CLINICAL DATA:  Shortness of breath, elevated D-dimer EXAM: CT ANGIOGRAPHY CHEST WITH CONTRAST TECHNIQUE: Multidetector CT imaging of the chest was performed using the standard protocol during bolus administration of intravenous contrast. Multiplanar CT image reconstructions and MIPs were obtained to evaluate the vascular anatomy. CONTRAST:  37mL OMNIPAQUE IOHEXOL 350 MG/ML SOLN COMPARISON:  Chest radiograph 11/07/2019 FINDINGS: Cardiovascular: Satisfactory opacification the pulmonary arteries to the segmental level. No pulmonary artery filling defects are identified. Central pulmonary arteries are borderline enlarged but this finding is favored to reflect chronic pulmonary artery hypertension in the absence of filling defects. Normal cardiac size.  Coronary artery calcifications are present. Dense calcification of the mitral annulus. No pericardial effusion. Normal physiologic fluid in the pericardial recesses. Major venous structures are unremarkable. Mediastinum/Nodes: There is extensive mediastinal and hilar adenopathy. A larger nodal conglomerate is seen in the subcarinal space measuring up to 23 mm (4/52). Enlarged 12 mm right hilar node (4/59) as well as several borderline enlarged AP window lymph nodes including a 16 mm node (4/45). No acute abnormality of the trachea. Mild nonspecific thickening of the thoracic esophagus. Lungs/Pleura: There is extensive interlobular septal thickening with some interspersed areas of mosaic attenuation and patchy ground-glass. Few multifocal more consolidative airspace opacities are noted as well. Largest focal consolidation seen in right lung base (4/81) measuring up to 2 cm in size. Small bilateral effusions, right slightly greater than left with adjacent passive atelectasis. Upper Abdomen: Heterogeneous lesion seen in the spleen measuring up to 3.8 cm in size. Some irregular internal calcification is noted. Slight left lobe hypertrophy of the liver. Upper abdomen is otherwise unremarkable. Circumferential body wall edema is noted. Musculoskeletal: Multilevel degenerative changes are present in the imaged portions of the spine. No suspicious chest wall lesions. Review of the MIP images confirms the above findings. IMPRESSION: 1. No evidence of acute pulmonary embolism. 2. Central pulmonary arteries are borderline enlarged but this finding is favored to reflect chronic pulmonary artery hypertension in the absence of filling defects. 3. Extensive interlobular septal thickening with some interspersed areas of mosaic attenuation and patchy ground-glass. Few multifocal more consolidative airspace opacities are noted as well. These findings are favored to represent pulmonary edema, however there are features suggesting  superimposed infection as well with a more consolidated 2 cm opacity in the right lung base. Consider 6-8 week follow-up imaging to assess for resolution. 4. Small bilateral pleural effusions, right slightly greater than left. Additional body wall edema may reflect early anasarca. 5. Extensive mediastinal and hilar adenopathy. Differential could include either reactive or edematous nodes given setting of edema and likely infection however given the splenic mass detailed below, malignancy is not fully excluded. Could also be re-evaluated on follow-up imaging. 6. Heterogeneous lesion in the spleen measuring up to 3.8 cm in size with irregular internal calcification. This finding is nonspecific and could represent a benign lesion such as hemangioma, however other etiologies are not excluded. Consider further evaluation dedicated splenic MRI with contrast on a nonemergent outpatient basis for best imaging quality. This  recommendation follows ACR consensus guidelines: White Paper of the ACR Incidental Findings Committee II on Splenic and Nodal Findings. J Am Coll Radiol 2013;10:789-794. 7. Mild nonspecific thickening of the thoracic esophagus. Correlate for features of esophagitis. 8. Slight left lobe hypertrophy of the liver. Correlate with any history of liver disease or risk factors for cirrhosis. These results were called by telephone at the time of interpretation on 11/08/2019 at 1:26 am to provider [facility] , who verbally acknowledged these results. Electronically Signed   By: Kreg Shropshire M.D.   On: 11/08/2019 01:26   DG Chest Portable 1 View  Result Date: 11/07/2019 CLINICAL DATA:  Shortness of breath cough EXAM: PORTABLE CHEST 1 VIEW COMPARISON:  July 30, 2016 FINDINGS: The heart size and mediastinal contours are unchanged with cardiomegaly. Aortic knob calcifications. Mildly increased interstitial markings are seen throughout both lungs, predominantly at both lung bases. There is prominence of the  central pulmonary vasculature. No pleural effusion is seen. No acute osseous abnormality. IMPRESSION: Diffusely increased interstitial opacities, predominantly at both lung bases, likely due to interstitial edema. Mild cardiomegaly Electronically Signed   By: Jonna Clark M.D.   On: 11/07/2019 23:06   3:35 AM Patient negative for SARS 2 and influenza a and B.  Patient noted to be in atrial fibrillation.  Will have patient admitted.  Patient's oxygen saturation 93% on 3 L by nasal cannula.    EKG Interpretation  Date/Time:  Thursday November 08 2019 03:36:40 EST Ventricular Rate:  104 PR Interval:    QRS Duration: 86 QT Interval:  317 QTC Calculation: 417 R Axis:   94 Text Interpretation: Atrial fibrillation Right axis deviation Low voltage, precordial leads Previously NSR Confirmed by Parul Porcelli ([id number]) on 11/08/2019 3:38:31 AM      3:50 AM Dr. Selena Batten accepts for admission to hospitalist service.    Denver Harder, Jonny Ruiz, MD 11/08/19 [phone]

## 2019-11-08 NOTE — H&P (Signed)
History and Physical    Angela Beasley WCH:[id number] DOB: [birth year] DOA: 11/07/2019  Referring MD/NP/PA: Pearson Grippe, MD PCP: Karle Plumber, MD  Patient coming from: Transfer from [facility]  Chief Complaint: Shortness of breath  I have personally briefly reviewed patient's old medical records in [facility]   HPI: Angela Beasley is a 86 y.o. female with medical history significant of hypertension, hypothyroidism, COPD with chronic bronchitis, fibromyalgia, and arthritis.  She presents with complaints of cough and shortness of breath.  At baseline patient is wheelchair bound and lives at home with family.  She noticed that with any kind of activity of moving her arms she was becoming extremely short of breath.  Complains of having chest congestion, but unable to cough anything up.  Reported associated symptoms of wheezing, orthopnea, and leg swelling.  Does not report any change in her weight and is not on any blood thinners.  O2 saturations as low as 70-80% on room air at home for which EMS was called.  She is not normally on oxygen.  Patient denies any history of irregular heart beat or heart failure.  She had just recently completed a course of azithromycin on 2/9.  In [facility] with [facility] patient was placed on 6 L of nasal cannula oxygen and given a breathing treatment.   ED Course: Patient was noted to be afebrile, heart rates up to 119 in atrial fibrillation, O2 saturations 92 - 98% on 3 L of nasal cannula oxygen, and all other vital signs relatively maintained.  Labs significant for CBC within normal limits, BUN 29, creatinine 1.22, calcium 8.7, BNP 81.1, high-sensitivity troponin negative, and D-dimer 1.9.  CT angiogram of the chest revealed no PE, concern for pulmonary edema/possible superimposed infection, bilateral pleural effusions, extensive mediastinal and hilar adenopathy, heterogeneous lesion in the spleen measuring 3.8 cm, and signs suggestive of esophagitis.  Revealed she  received breathing treatments and 125 mg of Solu-Medrol IV.  Influenza and COVID-19 screening were negative.  TRH called to admit and accepted to a progressive bed at [facility].  Review of Systems  Constitutional: Positive for malaise/fatigue. Negative for fever.  HENT: Negative for ear discharge and nosebleeds.   Eyes: Negative for double vision and photophobia.  Respiratory: Positive for cough, shortness of breath and wheezing. Negative for sputum production.   Cardiovascular: Positive for leg swelling.  Gastrointestinal: Negative for nausea and vomiting.  Genitourinary: Negative for dysuria and hematuria.  Musculoskeletal: Negative for falls.  Neurological: Negative for speech change and focal weakness.  Psychiatric/Behavioral: Negative for substance abuse.    Past Medical History:  Diagnosis Date  . Arthritis   . Bronchitis   . COPD (chronic obstructive pulmonary disease) (HCC)   . Fibromyalgia   . Hypertension   . Hypothyroidism   . Neuropathy   . Shingles     Past Surgical History:  Procedure Laterality Date  . BACK SURGERY    . CESAREAN SECTION    . NECK SURGERY    . PARTIAL HYSTERECTOMY    . TONSILLECTOMY    . TOTAL HIP ARTHROPLASTY       reports that she has quit smoking. She has never used smokeless tobacco. She reports that she does not drink alcohol or use drugs.  Allergies  Allergen Reactions  . Lisinopril   . Phenobarbital     No family history on file.  Prior to Admission medications   Medication Sig Start Date End Date Taking? Authorizing Provider  amLODipine (NORVASC)  10 MG tablet Take 10 mg by mouth daily. 10/31/19   [provider]  citalopram (CELEXA) 40 MG tablet Take 40 mg by mouth daily. 10/31/19   [provider]  diclofenac (VOLTAREN) 75 MG EC tablet Take 75 mg by mouth 2 (two) times daily. 10/31/19   [provider]  gabapentin (NEURONTIN) 300 MG capsule Take 300 mg by mouth 3 (three) times daily. 10/31/19   [provider]  levalbuterol Penne Lash) 0.63 MG/3ML nebulizer solution Take by nebulization 4 (four) times daily. 11/02/19   [provider]  levothyroxine (SYNTHROID) 50 MCG tablet Take 50 mcg by mouth daily. 10/15/19   [provider]  mupirocin ointment (BACTROBAN) 2 % APPLY EXTERNALLY TO THE AFFECTED AREA THREE TIMES DAILY 10/31/19   [provider]  traMADol (ULTRAM) 50 MG tablet Take 50 mg by mouth 3 (three) times daily. 10/31/19   [provider]    Physical Exam:  Constitutional: Obese elderly female currently in NAD, calm, comfortable Vitals:   11/08/19 0330 11/08/19 0434 11/08/19 0500 11/08/19 0623  BP: 138/68  123/88 (!) 141/67  Pulse: 77  (!) 119 (!) 113  Resp: 14  14 20   Temp:    98.5 F (36.9 C)  TempSrc:    Oral  SpO2: 93%  96% 96%  Weight:  113.4 kg     Eyes: PERRL, lids and conjunctivae normal ENMT: Mucous membranes are moist. Posterior pharynx clear of any exudate or lesions.Normal dentition.  Neck: normal, supple, no masses, no thyromegaly Respiratory: Decreased aeration with mild expiratory wheeze appreciated and crackles.  Patient currently on 3 L of nasal cannula oxygen. Cardiovascular: Regular rate and rhythm, no murmurs / rubs / gallops. Trace lower extremity edema. 2+ pedal pulses. No carotid bruits.  Abdomen: no tenderness, no masses palpated. No hepatosplenomegaly. Bowel sounds positive.  Musculoskeletal: no clubbing / cyanosis. No joint deformity upper and lower extremities. Good ROM, no contractures. Normal muscle tone.  Skin: no rashes, lesions, ulcers. No induration Neurologic: CN 2-12 grossly intact. Sensation intact, DTR normal. Strength 5/5 in all 4.  Psychiatric: Normal judgment and insight. Alert and oriented x 3. Normal mood.     Labs on Admission: I have personally reviewed following labs and imaging studies  CBC: Recent Labs  Lab 11/07/19 2320  WBC 7.9  NEUTROABS 5.6  HGB 13.3  HCT 41.2  MCV 90.0  PLT 217     Basic Metabolic Panel: Recent Labs  Lab 11/07/19 2320  NA 137  K 4.4  CL 104  CO2 25  GLUCOSE 93  BUN 29*  CREATININE 1.22*  CALCIUM 8.7*   GFR: CrCl cannot be calculated (Unknown ideal weight.). Liver Function Tests: Recent Labs  Lab 11/07/19 2320  AST 18  ALT 15  ALKPHOS 49  BILITOT 0.8  PROT 7.0  ALBUMIN 3.5   No results for input(s): LIPASE, AMYLASE in the last 168 hours. No results for input(s): AMMONIA in the last 168 hours. Coagulation Profile: No results for input(s): INR, PROTIME in the last 168 hours. Cardiac Enzymes: No results for input(s): CKTOTAL, CKMB, CKMBINDEX, TROPONINI in the last 168 hours. BNP (last 3 results) No results for input(s): PROBNP in the last 8760 hours. HbA1C: No results for input(s): HGBA1C in the last 72 hours. CBG: No results for input(s): GLUCAP in the last 168 hours. Lipid Profile: No results for input(s): CHOL, HDL, LDLCALC, TRIG, CHOLHDL, LDLDIRECT in the last 72 hours. Thyroid Function Tests: No results for input(s): TSH, T4TOTAL, FREET4,  T3FREE, THYROIDAB in the last 72 hours. Anemia Panel: No results for input(s): VITAMINB12, FOLATE, FERRITIN, TIBC, IRON, RETICCTPCT in the last 72 hours. Urine analysis: No results found for: COLORURINE, APPEARANCEUR, LABSPEC, PHURINE, GLUCOSEU, HGBUR, BILIRUBINUR, KETONESUR, PROTEINUR, UROBILINOGEN, NITRITE, LEUKOCYTESUR Sepsis Labs: Recent Results (from the past 240 hour(s))  Respiratory Panel by RT PCR (Flu A&B, Covid) - Nasopharyngeal Swab     Status: None   Collection Time: 11/08/19  1:00 AM   Specimen: Nasopharyngeal Swab  Result Value Ref Range Status   SARS Coronavirus 2 by RT PCR NEGATIVE NEGATIVE Final    Comment: (NOTE) SARS-CoV-2 target nucleic acids are NOT DETECTED. The SARS-CoV-2 RNA is generally detectable in upper respiratoy specimens during the acute phase of infection. The lowest concentration of SARS-CoV-2 viral copies this assay can detect is 131 copies/mL. A  negative result does not preclude SARS-Cov-2 infection and should not be used as the sole basis for treatment or other patient management decisions. A negative result may occur with  improper specimen collection/handling, submission of specimen other than nasopharyngeal swab, presence of viral mutation(s) within the areas targeted by this assay, and inadequate number of viral copies (<131 copies/mL). A negative result must be combined with clinical observations, patient history, and epidemiological information. The expected result is Negative. Fact Sheet for Patients:  https://www.moore.com/ Fact Sheet for Healthcare Providers:  https://www.young.biz/ This test is not yet ap proved or cleared by the [place] FDA and  has been authorized for detection and/or diagnosis of SARS-CoV-2 by FDA under an Emergency Use Authorization (EUA). This EUA will remain  in effect (meaning this test can be used) for the duration of the COVID-19 declaration under Section 564(b)(1) of the Act, 21 U.S.C. section 360bbb-3(b)(1), unless the authorization is terminated or revoked sooner.    Influenza A by PCR NEGATIVE NEGATIVE Final   Influenza B by PCR NEGATIVE NEGATIVE Final    Comment: (NOTE) The Xpert Xpress SARS-CoV-2/FLU/RSV assay is intended as an aid in  the diagnosis of influenza from Nasopharyngeal swab specimens and  should not be used as a sole basis for treatment. Nasal washings and  aspirates are unacceptable for Xpert Xpress SARS-CoV-2/FLU/RSV  testing. Fact Sheet for Patients: https://www.moore.com/ Fact Sheet for Healthcare Providers: https://www.young.biz/ This test is not yet approved or cleared by the [place] FDA and  has been authorized for detection and/or diagnosis of SARS-CoV-2 by  FDA under an Emergency Use Authorization (EUA). This EUA will remain  in effect (meaning this test can be used) for  the duration of the  Covid-19 declaration under Section 564(b)(1) of the Act, 21  U.S.C. section 360bbb-3(b)(1), unless the authorization is  terminated or revoked. Performed at [facility], [address], [place], [place] [address]      Radiological Exams on Admission: CT Angio Chest PE W and/or Wo Contrast  Result Date: 11/08/2019 CLINICAL DATA:  Shortness of breath, elevated D-dimer EXAM: CT ANGIOGRAPHY CHEST WITH CONTRAST TECHNIQUE: Multidetector CT imaging of the chest was performed using the standard protocol during bolus administration of intravenous contrast. Multiplanar CT image reconstructions and MIPs were obtained to evaluate the vascular anatomy. CONTRAST:  109mL OMNIPAQUE IOHEXOL 350 MG/ML SOLN COMPARISON:  Chest radiograph 11/07/2019 FINDINGS: Cardiovascular: Satisfactory opacification the pulmonary arteries to the segmental level. No pulmonary artery filling defects are identified. Central pulmonary arteries are borderline enlarged but this finding is favored to reflect chronic pulmonary artery hypertension in the absence of filling defects. Normal cardiac size. Coronary artery calcifications are  present. Dense calcification of the mitral annulus. No pericardial effusion. Normal physiologic fluid in the pericardial recesses. Major venous structures are unremarkable. Mediastinum/Nodes: There is extensive mediastinal and hilar adenopathy. A larger nodal conglomerate is seen in the subcarinal space measuring up to 23 mm (4/52). Enlarged 12 mm right hilar node (4/59) as well as several borderline enlarged AP window lymph nodes including a 16 mm node (4/45). No acute abnormality of the trachea. Mild nonspecific thickening of the thoracic esophagus. Lungs/Pleura: There is extensive interlobular septal thickening with some interspersed areas of mosaic attenuation and patchy ground-glass. Few multifocal more consolidative airspace opacities are noted as well. Largest focal consolidation  seen in right lung base (4/81) measuring up to 2 cm in size. Small bilateral effusions, right slightly greater than left with adjacent passive atelectasis. Upper Abdomen: Heterogeneous lesion seen in the spleen measuring up to 3.8 cm in size. Some irregular internal calcification is noted. Slight left lobe hypertrophy of the liver. Upper abdomen is otherwise unremarkable. Circumferential body wall edema is noted. Musculoskeletal: Multilevel degenerative changes are present in the imaged portions of the spine. No suspicious chest wall lesions. Review of the MIP images confirms the above findings. IMPRESSION: 1. No evidence of acute pulmonary embolism. 2. Central pulmonary arteries are borderline enlarged but this finding is favored to reflect chronic pulmonary artery hypertension in the absence of filling defects. 3. Extensive interlobular septal thickening with some interspersed areas of mosaic attenuation and patchy ground-glass. Few multifocal more consolidative airspace opacities are noted as well. These findings are favored to represent pulmonary edema, however there are features suggesting superimposed infection as well with a more consolidated 2 cm opacity in the right lung base. Consider 6-8 week follow-up imaging to assess for resolution. 4. Small bilateral pleural effusions, right slightly greater than left. Additional body wall edema may reflect early anasarca. 5. Extensive mediastinal and hilar adenopathy. Differential could include either reactive or edematous nodes given setting of edema and likely infection however given the splenic mass detailed below, malignancy is not fully excluded. Could also be re-evaluated on follow-up imaging. 6. Heterogeneous lesion in the spleen measuring up to 3.8 cm in size with irregular internal calcification. This finding is nonspecific and could represent a benign lesion such as hemangioma, however other etiologies are not excluded. Consider further evaluation dedicated  splenic MRI with contrast on a nonemergent outpatient basis for best imaging quality. This recommendation follows ACR consensus guidelines: White Paper of the ACR Incidental Findings Committee II on Splenic and Nodal Findings. J Am Coll Radiol 2013;10:789-794. 7. Mild nonspecific thickening of the thoracic esophagus. Correlate for features of esophagitis. 8. Slight left lobe hypertrophy of the liver. Correlate with any history of liver disease or risk factors for cirrhosis. These results were called by telephone at the time of interpretation on 11/08/2019 at 1:26 am to provider [facility] , who verbally acknowledged these results. Electronically Signed   By: Kreg Shropshire M.D.   On: 11/08/2019 01:26   DG Chest Portable 1 View  Result Date: 11/07/2019 CLINICAL DATA:  Shortness of breath cough EXAM: PORTABLE CHEST 1 VIEW COMPARISON:  July 30, 2016 FINDINGS: The heart size and mediastinal contours are unchanged with cardiomegaly. Aortic knob calcifications. Mildly increased interstitial markings are seen throughout both lungs, predominantly at both lung bases. There is prominence of the central pulmonary vasculature. No pleural effusion is seen. No acute osseous abnormality. IMPRESSION: Diffusely increased interstitial opacities, predominantly at both lung bases, likely due to interstitial edema. Mild cardiomegaly Electronically  Signed   By: Jonna Clark M.D.   On: 11/07/2019 23:06    EKG: Independently reviewed.  Atrial fibrillation at 104 bpm with right axis deviation  Assessment/Plan Acute respiratory failure with hypoxia secondary to COPD exacerbation: Patient presents with complaints of shortness of breath, cough, and wheezing.  Imaging studies concerning for interstitial edema and with mild cardiomegaly.  Patient had been given breathing treatments and 125 mg of Solu-Medrol IV. -Admit to a progressive bed -Continuous pulse oximetry with nasal cannula oxygen to maintain O2 saturation   -Xopenex and  ipratropium nebs 3 times daily -Prednisone 40 mg daily  Paroxysmal atrial fibrillation: Acute.  Appears to be in new onset atrial fibrillation with heart rates into the 110s, but currently back in sinus rhythm.  CHA2DS2-VASc score equal to at least 4 based off age, sex, and hypertension.  She was not on any form of anticoagulation. -Goal potassium 4 and magnesium  -Lovenox per pharmacy -Check echocardiogram -Cardiology consulted with recommendations as seen below -Start Cardizem 60 mg p.o. every 8 hours per the recommendations -Recommending transitioning to Eliquis,  if no further invasive testing warranted  Cardiomegaly/question congestive heart failure exacerbation: Acute.  Patient presents with complaints of lower extremity swelling and recently worsening shortness of breath.  Chest x-ray noting pulmonary edema and mild cardiomegaly.  BNP 81.1, but suspect this is secondary to patient's weight.  No previous history of heart failure reported. -Strict intake and output -Daily weights -Furosemide 40 mg IV x1 -Reassess and determine if further need of diuresis warranted -Follow-up echocardiogram  Lower extremity swelling: Acute.  Patient found to have elevated D-dimer.  CT angiogram of the chest did not show any signs of a PE, but we will rule out the possibility of DVT.  Suspect symptoms secondary to heart failure. -Check Doppler ultrasound of the lower extremities  Renal insufficiency: Acute.  On admission creatinine noted to be 1.22 with BUN 29.  No previous baseline with which to compare at this time.  Suspect poor perfusion as a cause of patient's kidney injury. -Hold nephrotoxic agent such as diclofenac -Continue to monitor kidney function with diuresis  Hypothyroidism -Check TSH -Continue levothyroxine  wheelchair-bound: Patient noted to have signs of pressure ulcer on admission by nursing staff. -Low air loss mattress replacement  Essential hypertension: Pressures currently  maintained Home medications include amlodipine 10 mg daily. -Hold amlodipine for now  Lesion of the spleen: Incidental finding seen on CT scan. - May warrant further work-up once patient's acute problems resolved.   GI prophylaxis: Protonix DVT prophylaxis: Lovenox Code Status: Full Family Communication: No family present at bedside Disposition Plan: Possible discharge home in 1 to 2 days Consults called: Cardiology Admission status: Inpatient  Clydie Braun MD [facility] Pager [phone]   If 7PM-7AM, please contact night-coverage [email or web address] Password [facility]  11/08/2019, 7:37 AM

## 2019-11-08 NOTE — Plan of Care (Signed)
Xcover 86 yo female with Fibromyalgia, h/o shingles w neuropathy Copd, apparently presents with dyspnea , was hypoxic, pox 84% and has new onset afib   CTA chest ->  IMPRESSION: 1. No evidence of acute pulmonary embolism. 2. Central pulmonary arteries are borderline enlarged but this finding is favored to reflect chronic pulmonary artery hypertension in the absence of filling defects. 3. Extensive interlobular septal thickening with some interspersed areas of mosaic attenuation and patchy ground-glass. Few multifocal more consolidative airspace opacities are noted as well. These findings are favored to represent pulmonary edema, however there are features suggesting superimposed infection as well with a more consolidated 2 cm opacity in the right lung base. Consider 6-8 week follow-up imaging to assess for resolution. 4. Small bilateral pleural effusions, right slightly greater than left. Additional body wall edema may reflect early anasarca. 5. Extensive mediastinal and hilar adenopathy. Differential could include either reactive or edematous nodes given setting of edema and likely infection however given the splenic mass detailed below, malignancy is not fully excluded. Could also be re-evaluated on follow-up imaging. 6. Heterogeneous lesion in the spleen measuring up to 3.8 cm in size with irregular internal calcification. This finding is nonspecific and could represent a benign lesion such as hemangioma, however other etiologies are not excluded. Consider further evaluation dedicated splenic MRI with contrast on a nonemergent outpatient basis for best imaging quality. This recommendation follows ACR consensus guidelines: White Paper of the ACR Incidental Findings Committee II on Splenic and Nodal Findings. J Am Coll Radiol 2013;10:789-794. 7. Mild nonspecific thickening of the thoracic esophagus. Correlate for features of esophagitis. 8. Slight left lobe hypertrophy of the liver. Correlate with  any history of liver disease or risk factors for cirrhosis.  Na 137, K 4.4,  Bun 29, Creatinine 1.22 Ast 18, Alt 15 Wbc 7.9, Hgb 13.3, Plt 217 BNP 81.1 Trop 9 Covid -19 pcr negative    ED requesting admission for acute respiratory failure with hypoxia, pneumonia, and new onset Afib w RVR

## 2019-11-08 NOTE — TOC Initial Note (Signed)
Transition of Care [facility]) - Initial/Assessment Note    Patient Details  Name: Angela Beasley MRN: [id number] Date of Birth: [birth year]  Transition of Care [facility]) CM/SW Contact:    Cherylann Parr, RN Phone Number: 11/08/2019, 1:57 PM  Clinical Narrative:  PTA from home with her daughter and son in law.  Pt informed CM that over the last couple of years she has had a mobility decline and typically uses wheelchair and scooter in the home, has walker as well.  Pt informed CM that she would be interested in therapy assessment during this hospitalization - CM will ordered based on HF protocol.  Pt educated on importance of low salt diet an the importance of daily weights .  Pt informed CM that when she lived in [place] she was active with the [place] but has not established care in [place] with the [place] and will need to re qualify with the [place] - her PCP is not with the [place].                  Expected Discharge Plan: Home/Self Care     Patient Goals and CMS Choice        Expected Discharge Plan and Services Expected Discharge Plan: Home/Self Care       Living arrangements for the past 2 months: Single Family Home                                      Prior Living Arrangements/Services Living arrangements for the past 2 months: Single Family Home Lives with:: Relatives(daughter and son in law lives with pt) Patient language and need for interpreter reviewed:: Yes Do you feel safe going back to the place where you live?: Yes      Need for Family Participation in Patient Care: Yes (Comment) Care giver support system in place?: Yes (comment) Current home services: DME(scooter, wheelchair, walker and transfer chair) Criminal Activity/Legal Involvement Pertinent to Current Situation/Hospitalization: No - Comment as needed  Activities of Daily Living Home Assistive Devices/Equipment: None ADL Screening (condition at time of admission) Patient's cognitive ability adequate to safely complete  daily activities?: Yes Is the patient deaf or have difficulty hearing?: Yes Does the patient have difficulty seeing, even when wearing glasses/contacts?: No Does the patient have difficulty concentrating, remembering, or making decisions?: No Patient able to express need for assistance with ADLs?: Yes Does the patient have difficulty dressing or bathing?: Yes Independently performs ADLs?: Yes (appropriate for developmental age) Communication: Independent Dressing (OT): Needs assistance Grooming: Needs assistance Feeding: Independent Bathing: Needs assistance Toileting: Needs assistance In/Out Bed: Needs assistance Walks in Home: Needs assistance Does the patient have difficulty walking or climbing stairs?: Yes Weakness of Legs: Both Weakness of Arms/Hands: Right  Permission Sought/Granted                  Emotional Assessment   Attitude/Demeanor/Rapport: Self-Confident, Gracious, Charismatic, Engaged Affect (typically observed): Accepting, Adaptable Orientation: : Oriented to Self, Oriented to Place, Oriented to  Time, Oriented to Situation   Psych Involvement: No (comment)  Admission diagnosis:  Orthopnea [R06.01] Shortness of breath [R06.02] Cough [R05] Peripheral edema [R60.9] Hypoxia [R09.02] New onset atrial fibrillation (HCC) [I48.91] Acute respiratory failure with hypoxia (HCC) [J96.01] Patient Active Problem List   Diagnosis Date Noted  . Acute respiratory failure with hypoxia (HCC) 11/08/2019  . Pressure injury of skin 11/08/2019  . Cardiomegaly 11/08/2019  .  Swelling of lower extremity 11/08/2019  . Renal insufficiency 11/08/2019  . Hypothyroidism 11/08/2019  . Wheelchair bound 11/08/2019  . Lesion of spleen 11/08/2019  . AF (paroxysmal atrial fibrillation) ([place]) 11/08/2019  . COPD with acute exacerbation ([place]) 11/08/2019   PCP:  Guadlupe Spanish, MD Pharmacy:   [facility] [phone] [address], [address] AT [address] [place] &  [address] [place] [address] Phone: [phone] Fax: [phone]     Social Determinants of Health ([place]) Interventions    Readmission Risk Interventions No flowsheet data found.

## 2019-11-09 DIAGNOSIS — J9601 Acute respiratory failure with hypoxia: Secondary | ICD-10-CM

## 2019-11-09 DIAGNOSIS — M7989 Other specified soft tissue disorders: Secondary | ICD-10-CM

## 2019-11-09 DIAGNOSIS — J441 Chronic obstructive pulmonary disease with (acute) exacerbation: Secondary | ICD-10-CM

## 2019-11-09 DIAGNOSIS — D7389 Other diseases of spleen: Secondary | ICD-10-CM

## 2019-11-09 DIAGNOSIS — Z993 Dependence on wheelchair: Secondary | ICD-10-CM

## 2019-11-09 DIAGNOSIS — Z9189 Other specified personal risk factors, not elsewhere classified: Secondary | ICD-10-CM

## 2019-11-09 DIAGNOSIS — N289 Disorder of kidney and ureter, unspecified: Secondary | ICD-10-CM

## 2019-11-09 DIAGNOSIS — I5031 Acute diastolic (congestive) heart failure: Secondary | ICD-10-CM

## 2019-11-09 DIAGNOSIS — L89302 Pressure ulcer of unspecified buttock, stage 2: Secondary | ICD-10-CM

## 2019-11-09 DIAGNOSIS — I10 Essential (primary) hypertension: Secondary | ICD-10-CM

## 2019-11-09 DIAGNOSIS — R5381 Other malaise: Secondary | ICD-10-CM

## 2019-11-09 DIAGNOSIS — I480 Paroxysmal atrial fibrillation: Secondary | ICD-10-CM

## 2019-11-09 DIAGNOSIS — E039 Hypothyroidism, unspecified: Secondary | ICD-10-CM

## 2019-11-09 LAB — BASIC METABOLIC PANEL
Anion gap: 13 (ref 5–15)
BUN: 38 mg/dL — ABNORMAL HIGH (ref 8–23)
CO2: 22 mmol/L (ref 22–32)
Calcium: 8.7 mg/dL — ABNORMAL LOW (ref 8.9–10.3)
Chloride: 103 mmol/L (ref 98–111)
Creatinine, Ser: 1.45 mg/dL — ABNORMAL HIGH (ref 0.44–1.00)
GFR calc Af Amer: 37 mL/min — ABNORMAL LOW (ref >60)
GFR calc non Af Amer: 32 mL/min — ABNORMAL LOW (ref >60)
Glucose, Bld: 121 mg/dL — ABNORMAL HIGH (ref 70–99)
Potassium: 5.1 mmol/L (ref 3.5–5.1)
Sodium: 138 mmol/L (ref 135–145)

## 2019-11-09 LAB — GLUCOSE, CAPILLARY: Glucose-Capillary: 108 mg/dL — ABNORMAL HIGH (ref 70–99)

## 2019-11-09 MED ORDER — DOXYCYCLINE HYCLATE 100 MG PO TABS
100.0000 mg | ORAL_TABLET | Freq: Two times a day (BID) | ORAL | Status: DC
  Administered 2019-11-09 – 2019-11-10 (×3): 100 mg via ORAL
  Filled 2019-11-09 (×3): qty 1

## 2019-11-09 MED ORDER — DILTIAZEM HCL ER COATED BEADS 180 MG PO CP24
180.0000 mg | ORAL_CAPSULE | Freq: Every day | ORAL | Status: DC
  Administered 2019-11-09 – 2019-11-10 (×2): 180 mg via ORAL
  Filled 2019-11-09 (×2): qty 1

## 2019-11-09 MED ORDER — APIXABAN 5 MG PO TABS
5.0000 mg | ORAL_TABLET | Freq: Two times a day (BID) | ORAL | Status: DC
  Administered 2019-11-09 – 2019-11-10 (×2): 5 mg via ORAL
  Filled 2019-11-09 (×2): qty 1

## 2019-11-09 MED ORDER — MOMETASONE FURO-FORMOTEROL FUM 200-5 MCG/ACT IN AERO
2.0000 | INHALATION_SPRAY | Freq: Two times a day (BID) | RESPIRATORY_TRACT | Status: DC
  Administered 2019-11-09 – 2019-11-10 (×3): 2 via RESPIRATORY_TRACT
  Filled 2019-11-09: qty 8.8

## 2019-11-09 NOTE — Evaluation (Signed)
Occupational Therapy Evaluation Patient Details Name: Angela Beasley MRN: [id number] DOB: 1929-10-09 Today's Date: 11/09/2019    History of Present Illness Pt is an 86 y.o. female admitted 11/07/19 with c/o cough and SOB; found to be hypoxic. Chest CT angiogram revealed no PE, concern for pulmonary edema/possible superimposed infection, bilateral pleural effusions, extensive mediastinal and hilar adenopathy, heterogeneous lesion in the spleen measuring 3.8 cm, and signs suggestive of esophagitis.TTE showed EF 50-55%. PMH includes COPD, HTN, fibromyalgia, arthritis; pt primarily uses w/c.   Clinical Impression   Pt admitted with the above diagnoses and presents with below problem list. Pt will benefit from continued acute OT to address the below listed deficits and maximize independence with basic ADLs prior to d/c home with family. At baseline, pt is mod I with stand-pivot transfers, has been functioning at w/c level, sponge bathes in lieu of shower transfer. PTA she was mostly setup to mod I with basic ADLs. She endorses needing her food items cut up and only being able to use a spoon to load food. She also has baseline limitations in shoulder flexion/abduction to about 60* and needing assist with hair washing/care and overhead tasks. She is followed by a "hand doctor." Began trial of AE for feeding tasks. Pt is currently min guard for LB ADLs and functional transfers.      Follow Up Recommendations  Home health OT;Supervision - Intermittent    Equipment Recommendations  None recommended by OT    Recommendations for Other Services       Precautions / Restrictions Precautions Precautions: Fall Restrictions Weight Bearing Restrictions: No      Mobility Bed Mobility Overal bed mobility: Needs Assistance Bed Mobility: Supine to Sit     Supine to sit: Min guard;HOB elevated     General bed mobility comments: min guard for safety  Transfers Overall transfer level: Needs  assistance Equipment used: 1 person hand held assist Transfers: Sit to/from [employer] Sit to Stand: Min guard Stand pivot transfers: Min guard       General transfer comment: min guard for light steadying assist on intitial stand. Utilized HHA at +1 level    Balance Overall balance assessment: Needs assistance Sitting-balance support: No upper extremity supported;Feet supported Sitting balance-Leahy Scale: Fair     Standing balance support: Single extremity supported Standing balance-Leahy Scale: Fair Standing balance comment: single extremity external support in static stand.                           ADL either performed or assessed with clinical judgement   ADL Overall ADL's : Needs assistance/impaired Eating/Feeding: Set up;Sitting;With adaptive utensils Eating/Feeding Details (indicate cue type and reason): provided built up foam, weighted spoon, and plate guard and instructed in use for pt to trial during mealtime.  Grooming: Set up;Sitting   Upper Body Bathing: Set up;Sitting   Lower Body Bathing: Sit to/from stand;Min guard   Upper Body Dressing : Set up;Sitting   Lower Body Dressing: Min guard;Sit to/from stand   Toilet Transfer: Min guard;Stand-pivot;BSC   Toileting- Architect and Hygiene: Min guard;Sit to/from Nurse, children's Details (indicate cue type and reason): N/a Functional mobility during ADLs: (pivot transfers only at baseline) General ADL Comments: Pt completed bed mobility and pivot transfer to sit up in recliner. Began trial of AE for feeding tasks.      Vision         Perception  Praxis      Pertinent Vitals/Pain Pain Assessment: No/denies pain     Hand Dominance Left   Extremity/Trunk Assessment Upper Extremity Assessment Upper Extremity Assessment: Generalized weakness;RUE deficits/detail;LUE deficits/detail RUE Deficits / Details: baseline limited shoulder  flexion/abduction to about 60* and impaired FMC. Pt reports being followed by a "hand doctor" who has recommended a nerve study RUE Coordination: decreased fine motor LUE Deficits / Details: baseline limited shoulder flexion/abduction to about 60* and impaired [place]. Pt reports being followed by a "hand doctor" who has recommended a nerve study LUE Coordination: decreased fine motor   Lower Extremity Assessment Lower Extremity Assessment: Defer to PT evaluation   Cervical / Trunk Assessment Cervical / Trunk Assessment: Kyphotic   Communication Communication Communication: HOH   Cognition Arousal/Alertness: Awake/alert Behavior During Therapy: WFL for tasks assessed/performed Overall Cognitive Status: Within Functional Limits for tasks assessed                                     General Comments  O2 93 on 3L O2 at end of session.    Exercises     Shoulder Instructions      Home Living Family/patient expects to be discharged to:: Private residence Living Arrangements: Children Available Help at Discharge: Family;Other (Comment)(family works during the day) Type of Home: House             Bathroom Shower/Tub: Other (comment)(sponge bathes only)     Bathroom Accessibility: No   Home Equipment: Walker - 2 wheels          Prior Functioning/Environment Level of Independence: Independent;Needs assistance  Gait / Transfers Assistance Needed: pt stand pivots at w/c level independently at baseline,  ADL's / Homemaking Assistance Needed: sponge bathes only, mostly independent with bathing/dressing. daughter assist with washing hair as pt with limited B shoulder flexion to about 60*. Family assists with IADLs. Family provides setup of meals including cutting up food items and pt reports needing to use a spoon due to impaired [facility]            OT Problem List: Decreased strength;Decreased range of motion;Impaired balance (sitting and/or standing);Decreased  activity tolerance;Decreased coordination;Decreased knowledge of use of DME or AE;Decreased knowledge of precautions;Impaired UE functional use;Pain      OT Treatment/Interventions: Self-care/ADL training;Therapeutic exercise;DME and/or AE instruction;Therapeutic activities;Patient/family education;Balance training    OT Goals(Current goals can be found in the care plan section) Acute Rehab OT Goals Patient Stated Goal: move better, improve mobility OT Goal Formulation: With patient Time For Goal Achievement: 11/23/19 Potential to Achieve Goals: Good ADL Goals Pt Will Perform Eating: with modified independence;with adaptive utensils Pt Will Perform Lower Body Bathing: with modified independence;sit to/from stand;with adaptive equipment Pt Will Perform Lower Body Dressing: with modified independence;sit to/from stand;with adaptive equipment Pt Will Transfer to Toilet: with modified independence;stand pivot transfer;bedside commode Pt Will Perform Toileting - Clothing Manipulation and hygiene: with modified independence;sit to/from stand  OT Frequency: Min 2X/week   Barriers to D/C:            Co-evaluation PT/OT/SLP Co-Evaluation/Treatment: Yes Reason for Co-Treatment: For patient/therapist safety;To address functional/ADL transfers   OT goals addressed during session: ADL's and self-care;Proper use of Adaptive equipment and DME      AM-PAC OT "6 Clicks" Daily Activity     Outcome Measure Help from another person eating meals?: A Little Help from another person taking care of personal grooming?:  A Little Help from another person toileting, which includes using toliet, bedpan, or urinal?: A Little Help from another person bathing (including washing, rinsing, drying)?: A Little Help from another person to put on and taking off regular upper body clothing?: None Help from another person to put on and taking off regular lower body clothing?: A Little 6 Click Score: 19   End of  Session Equipment Utilized During Treatment: Oxygen  Activity Tolerance: Patient tolerated treatment well Patient left: in chair;with call bell/phone within reach;with chair alarm set  OT Visit Diagnosis: Unsteadiness on feet (R26.81);Muscle weakness (generalized) (M62.81);Feeding difficulties (R63.3)                Time: 1005-1035 OT Time Calculation (min): 30 min Charges:  OT General Charges $OT Visit: 1 Visit OT Evaluation $OT Eval Low Complexity: 1 Low  Raynald Kemp, OT Acute Rehabilitation Services Pager: [phone] Office: [phone]   Pilar Grammes 11/09/2019, 10:52 AM

## 2019-11-09 NOTE — Evaluation (Signed)
Physical Therapy Evaluation Patient Details Name: Angela Beasley MRN: [id number] DOB: 1930-05-21 Today's Date: 11/09/2019   History of Present Illness  Pt is an 86 y.o. female admitted 11/07/19 with c/o cough and SOB; found to be hypoxic. Chest CT angiogram revealed no PE, concern for pulmonary edema/possible superimposed infection, bilateral pleural effusions, extensive mediastinal and hilar adenopathy, heterogeneous lesion in the spleen measuring 3.8 cm, and signs suggestive of esophagitis.TTE showed EF 50-55%. PMH includes COPD, HTN, fibromyalgia, arthritis; pt primarily uses w/c.    Clinical Impression  Pt presents with an overall decrease in functional mobility secondary to above. PTA, pt mod indep with stand pivot transfers to/from wheelchair, daughter assists with ADL tasks as needed; pt reports difficulty walking due to fearful of falling. Noted 4-5/5 BLE strength but poor balance strategies in standing, improved with UE support. SpO2 90-96% on 3L O2 [place] (pt reports she does not wear O2 baseline). Pt would benefit from continued acute PT services to maximize functional mobility and independence prior to d/c with HHPT services.     Follow Up Recommendations Home health PT;Supervision - Intermittent    Equipment Recommendations  None recommended by PT    Recommendations for Other Services       Precautions / Restrictions Precautions Precautions: Fall Restrictions Weight Bearing Restrictions: No      Mobility  Bed Mobility Overal bed mobility: Modified Independent Bed Mobility: Supine to Sit     Supine to sit: Min guard;HOB elevated     General bed mobility comments: HOB elevated  Transfers Overall transfer level: Needs assistance Equipment used: 1 person hand held assist Transfers: Sit to/from Stand Sit to Stand: Min guard Stand pivot transfers: Min guard       General transfer comment: min guard for light steadying assist on intitial stand; min guard for steps to  recliner  Ambulation/Gait                Stairs            Wheelchair Mobility    Modified Rankin (Stroke Patients Only)       Balance Overall balance assessment: Needs assistance Sitting-balance support: No upper extremity supported;Feet supported Sitting balance-Leahy Scale: Fair     Standing balance support: Single extremity supported Standing balance-Leahy Scale: Fair Standing balance comment: single extremity external support in static stand.                             Pertinent Vitals/Pain Pain Assessment: No/denies pain    Home Living Family/patient expects to be discharged to:: Private residence Living Arrangements: Children Available Help at Discharge: Family;Available PRN/intermittently Type of Home: House         Home Equipment: Walker - 2 wheels;Wheelchair - manual Additional Comments: Children work during day    Prior Function Level of Independence: Needs assistance   Gait / Transfers Assistance Needed: Reports independent with stand pivot transfers to/from wheelchair; does not walk due to fearful of falling  ADL's / Homemaking Assistance Needed: sponge bathes only, mostly independent with bathing/dressing. daughter assist with washing hair as pt with limited B shoulder flexion to about 60*. Family assists with IADLs. Family provides setup of meals including cutting up food items and pt reports needing to use a spoon due to impaired [facility].        Hand Dominance   Dominant Hand: Left    Extremity/Trunk Assessment   Upper Extremity Assessment Upper Extremity Assessment: Generalized weakness;RUE deficits/detail;LUE  deficits/detail RUE Deficits / Details: baseline limited shoulder flexion/abduction to about 60* and impaired [place]MC. Pt reports being followed by a "hand doctor" who has recommended a nerve study RUE Coordination: decreased gross motor;decreased fine motor LUE Deficits / Details: baseline limited shoulder  flexion/abduction to about 60* and impaired [place]MC. Pt reports being followed by a "hand doctor" who has recommended a nerve study LUE Coordination: decreased gross motor;decreased fine motor    Lower Extremity Assessment Lower Extremity Assessment: Overall WFL for tasks assessed    Cervical / Trunk Assessment Cervical / Trunk Assessment: Kyphotic  Communication   Communication: HOH  Cognition Arousal/Alertness: Awake/alert Behavior During Therapy: WFL for tasks assessed/performed Overall Cognitive Status: Within Functional Limits for tasks assessed                                 General Comments: Good insight into health condition and current functional mobility status      General Comments General comments (skin integrity, edema, etc.): SpO2 90-96% on 3L O2 [place]; pt reports some SOB, no DOE noted    Exercises     Assessment/Plan    PT Assessment Patient needs continued PT services  PT Problem List Decreased activity tolerance;Decreased balance;Decreased mobility;Decreased knowledge of use of DME;Cardiopulmonary status limiting activity       PT Treatment Interventions DME instruction;Gait training;Functional mobility training;Therapeutic activities;Therapeutic exercise;Balance training;Patient/family education;Wheelchair mobility training    PT Goals (Current goals can be found in the Care Plan section)  Acute Rehab PT Goals Patient Stated Goal: move better, improve mobility PT Goal Formulation: With patient Time For Goal Achievement: 11/23/19 Potential to Achieve Goals: Good    Frequency Min 3X/week   Barriers to discharge Decreased caregiver support      Co-evaluation PT/OT/SLP Co-Evaluation/Treatment: Yes Reason for Co-Treatment: For patient/therapist safety;To address functional/ADL transfers PT goals addressed during session: Mobility/safety with mobility;Balance OT goals addressed during session: ADL's and self-care;Proper use of Adaptive  equipment and DME       AM-PAC PT "6 Clicks" Mobility  Outcome Measure Help needed turning from your back to your side while in a flat bed without using bedrails?: None Help needed moving from lying on your back to sitting on the side of a flat bed without using bedrails?: A Little Help needed moving to and from a bed to a chair (including a wheelchair)?: A Little Help needed standing up from a chair using your arms (e.g., wheelchair or bedside chair)?: A Little Help needed to walk in hospital room?: A Little Help needed climbing 3-5 steps with a railing? : A Little 6 Click Score: 19    End of Session Equipment Utilized During Treatment: Oxygen Activity Tolerance: Patient tolerated treatment well Patient left: in chair;with call bell/phone within reach;with chair alarm set Nurse Communication: Mobility status PT Visit Diagnosis: Other abnormalities of gait and mobility (R26.89)    Time: 0272-5366 PT Time Calculation (min) (ACUTE ONLY): 24 min   Charges:   PT Evaluation $PT Eval Moderate Complexity: 1 Mod        Ina Homes, PT, DPT Acute Rehabilitation Services  Pager [phone] Office [phone]  Malachy Chamber 11/09/2019, 12:00 PM

## 2019-11-09 NOTE — Progress Notes (Signed)
Progress Note  Patient Name: Angela Beasley Date of Encounter: 11/09/2019  Primary Cardiologist: No primary care provider on file.   Subjective  Converted to sinus rhythm yesterday.  Reports dyspnea improved today.  Inpatient Medications    Scheduled Meds: . calcium-vitamin D  1 tablet Oral Daily  . citalopram  40 mg Oral Daily  . diltiazem  60 mg Oral Q8H  . enoxaparin (LOVENOX) injection  1 mg/kg Subcutaneous Q12H  . gabapentin  300 mg Oral TID  . guaiFENesin  600 mg Oral BID  . influenza vaccine adjuvanted  0.5 mL Intramuscular Tomorrow-1000  . ipratropium  0.5 mg Nebulization TID  . levalbuterol  0.63 mg Nebulization TID  . levothyroxine  50 mcg Oral QAC breakfast  . loratadine  10 mg Oral Daily  . mometasone-formoterol  2 puff Inhalation BID  . mupirocin ointment  1 application Topical TID  . pantoprazole  40 mg Oral Daily  . pneumococcal 23 valent vaccine  0.5 mL Intramuscular Tomorrow-1000  . predniSONE  40 mg Oral Q breakfast  . sodium chloride flush  3 mL Intravenous Q12H   Continuous Infusions: . sodium chloride     PRN Meds: sodium chloride, acetaminophen, albuterol, ondansetron (ZOFRAN) IV, sodium chloride flush, traMADol   Vital Signs    Vitals:   11/08/19 2029 11/08/19 2338 11/09/19 0000 11/09/19 0308  BP: (!) 146/56 (!) 127/56  (!) 158/64  Pulse: 87 80  72  Resp: 16 12  16   Temp: 97.9 F (36.6 C) 97.8 F (36.6 C)  98.2 F (36.8 C)  TempSrc: Oral Oral  Oral  SpO2: 96% 97% 98% 94%  Weight:      Height:        Intake/Output Summary (Last 24 hours) at 11/09/2019 0751 Last data filed at 11/08/2019 1900 Gross per 24 hour  Intake --  Output 500 ml  Net -500 ml   Last 3 Weights 11/08/2019 11/08/2019  Weight (lbs) 262 lb 5.6 oz 250 lb  Weight (kg) 119 kg 113.399 kg      Telemetry    AF with rate in 100s then converted to NSR yesterday afternoon.  Currently in NSR with rate 60-70s - Personally Reviewed  ECG    No new ECG - Personally  Reviewed  Physical Exam   GEN: No acute distress.   Neck: No JVD Cardiac: RRR, no murmurs Respiratory: expiratory wheezing GI: Soft, nontender MS: No edema Neuro:  Nonfocal  Psych: Normal affect   Labs    High Sensitivity Troponin:   Recent Labs  Lab 11/07/19 2320 11/08/19 0837  TROPONINIHS 9 12      Chemistry Recent Labs  Lab 11/07/19 2320 11/09/19 0131  NA 137 138  K 4.4 5.1  CL 104 103  CO2 25 22  GLUCOSE 93 121*  BUN 29* 38*  CREATININE 1.22* 1.45*  CALCIUM 8.7* 8.7*  PROT 7.0  --   ALBUMIN 3.5  --   AST 18  --   ALT 15  --   ALKPHOS 49  --   BILITOT 0.8  --   GFRNONAA 40* 32*  GFRAA 46* 37*  ANIONGAP 8 13     Hematology Recent Labs  Lab 11/07/19 2320  WBC 7.9  RBC 4.58  HGB 13.3  HCT 41.2  MCV 90.0  MCH 29.0  MCHC 32.3  RDW 12.8  PLT 217    BNP Recent Labs  Lab 11/07/19 2320  BNP 81.1     DDimer  Recent Labs  Lab 11/07/19 2320  DDIMER 1.90*     Radiology    CT Angio Chest PE W and/or Wo Contrast  Result Date: 11/08/2019 CLINICAL DATA:  Shortness of breath, elevated D-dimer EXAM: CT ANGIOGRAPHY CHEST WITH CONTRAST TECHNIQUE: Multidetector CT imaging of the chest was performed using the standard protocol during bolus administration of intravenous contrast. Multiplanar CT image reconstructions and MIPs were obtained to evaluate the vascular anatomy. CONTRAST:  92mL OMNIPAQUE IOHEXOL 350 MG/ML SOLN COMPARISON:  Chest radiograph 11/07/2019 FINDINGS: Cardiovascular: Satisfactory opacification the pulmonary arteries to the segmental level. No pulmonary artery filling defects are identified. Central pulmonary arteries are borderline enlarged but this finding is favored to reflect chronic pulmonary artery hypertension in the absence of filling defects. Normal cardiac size. Coronary artery calcifications are present. Dense calcification of the mitral annulus. No pericardial effusion. Normal physiologic fluid in the pericardial recesses. Major  venous structures are unremarkable. Mediastinum/Nodes: There is extensive mediastinal and hilar adenopathy. A larger nodal conglomerate is seen in the subcarinal space measuring up to 23 mm (4/52). Enlarged 12 mm right hilar node (4/59) as well as several borderline enlarged AP window lymph nodes including a 16 mm node (4/45). No acute abnormality of the trachea. Mild nonspecific thickening of the thoracic esophagus. Lungs/Pleura: There is extensive interlobular septal thickening with some interspersed areas of mosaic attenuation and patchy ground-glass. Few multifocal more consolidative airspace opacities are noted as well. Largest focal consolidation seen in right lung base (4/81) measuring up to 2 cm in size. Small bilateral effusions, right slightly greater than left with adjacent passive atelectasis. Upper Abdomen: Heterogeneous lesion seen in the spleen measuring up to 3.8 cm in size. Some irregular internal calcification is noted. Slight left lobe hypertrophy of the liver. Upper abdomen is otherwise unremarkable. Circumferential body wall edema is noted. Musculoskeletal: Multilevel degenerative changes are present in the imaged portions of the spine. No suspicious chest wall lesions. Review of the MIP images confirms the above findings. IMPRESSION: 1. No evidence of acute pulmonary embolism. 2. Central pulmonary arteries are borderline enlarged but this finding is favored to reflect chronic pulmonary artery hypertension in the absence of filling defects. 3. Extensive interlobular septal thickening with some interspersed areas of mosaic attenuation and patchy ground-glass. Few multifocal more consolidative airspace opacities are noted as well. These findings are favored to represent pulmonary edema, however there are features suggesting superimposed infection as well with a more consolidated 2 cm opacity in the right lung base. Consider 6-8 week follow-up imaging to assess for resolution. 4. Small bilateral  pleural effusions, right slightly greater than left. Additional body wall edema may reflect early anasarca. 5. Extensive mediastinal and hilar adenopathy. Differential could include either reactive or edematous nodes given setting of edema and likely infection however given the splenic mass detailed below, malignancy is not fully excluded. Could also be re-evaluated on follow-up imaging. 6. Heterogeneous lesion in the spleen measuring up to 3.8 cm in size with irregular internal calcification. This finding is nonspecific and could represent a benign lesion such as hemangioma, however other etiologies are not excluded. Consider further evaluation dedicated splenic MRI with contrast on a nonemergent outpatient basis for best imaging quality. This recommendation follows ACR consensus guidelines: White Paper of the ACR Incidental Findings Committee II on Splenic and Nodal Findings. J Am Coll Radiol 2013;10:789-794. 7. Mild nonspecific thickening of the thoracic esophagus. Correlate for features of esophagitis. 8. Slight left lobe hypertrophy of the liver. Correlate with any history of liver disease or risk factors  for cirrhosis. These results were called by telephone at the time of interpretation on 11/08/2019 at 1:26 am to provider [facility] , who verbally acknowledged these results. Electronically Signed   By: Kreg Shropshire M.D.   On: 11/08/2019 01:26   DG Chest Portable 1 View  Result Date: 11/07/2019 CLINICAL DATA:  Shortness of breath cough EXAM: PORTABLE CHEST 1 VIEW COMPARISON:  July 30, 2016 FINDINGS: The heart size and mediastinal contours are unchanged with cardiomegaly. Aortic knob calcifications. Mildly increased interstitial markings are seen throughout both lungs, predominantly at both lung bases. There is prominence of the central pulmonary vasculature. No pleural effusion is seen. No acute osseous abnormality. IMPRESSION: Diffusely increased interstitial opacities, predominantly at both lung  bases, likely due to interstitial edema. Mild cardiomegaly Electronically Signed   By: Jonna Clark M.D.   On: 11/07/2019 23:06   ECHOCARDIOGRAM COMPLETE  Result Date: 11/08/2019    ECHOCARDIOGRAM REPORT   Patient Name:   Angela Beasley Date of Exam: 11/08/2019 Medical Rec #:  [id number]    Height:       68.0 in Accession #:    [id number]   Weight:       250.0 lb Date of Birth:  1930-04-11    BSA:          2.25 m Patient Age:    86 years     BP:           143/70 mmHg Patient Gender: F            HR:           112 bpm. Exam Location:  Inpatient Procedure: 2D Echo, Cardiac Doppler and Color Doppler Indications:    I50.33 Acute on chronic diastolic (congestive) heart failure  History:        Patient has no prior history of Echocardiogram examinations.                 COPD; Risk Factors:Hypertension. Hypothyroidism.  Sonographer:    Tiffany Dance Referring Phys: [id number] RONDELL A SMITH IMPRESSIONS  1. Left ventricular ejection fraction, by estimation, is 50 to 55%. The left ventricle has low normal function. The left ventrical has no regional wall motion abnormalities. Left ventricular diastolic parameters are consistent with Grade II diastolic dysfunction (pseudonormalization).  2. Right ventricular systolic function is normal. The right ventricular size is normal.  3. The mitral valve is grossly normal. no evidence of mitral valve regurgitation.  4. The aortic valve was not well visualized. Aortic valve regurgitation is not visualized. FINDINGS  Left Ventricle: Left ventricular ejection fraction, by estimation, is 50 to 55%. The left ventricle has low normal function. The left ventricle has no regional wall motion abnormalities. There is no left ventricular hypertrophy. Left ventricular diastolic parameters are consistent with Grade II diastolic dysfunction (pseudonormalization). Right Ventricle: The right ventricular size is normal. No increase in right ventricular wall thickness. Right ventricular systolic function  is normal. Left Atrium: Left atrial size was normal in size. Right Atrium: Right atrial size was normal in size. Pericardium: There is no evidence of pericardial effusion. Mitral Valve: The mitral valve is grossly normal. Moderate mitral annular calcification. No evidence of mitral valve regurgitation. Tricuspid Valve: The tricuspid valve is normal in structure. Tricuspid valve regurgitation is not demonstrated. Aortic Valve: The aortic valve was not well visualized. Aortic valve regurgitation is not visualized. Pulmonic Valve: The pulmonic valve was normal in structure. Pulmonic valve regurgitation is not visualized. Aorta: The aortic root, ascending aorta  and aortic arch are all structurally normal, with no evidence of dilitation or obstruction. IAS/Shunts: The atrial septum is grossly normal.  LEFT VENTRICLE PLAX 2D LVIDd:         4.90 cm LVIDs:         3.90 cm LV PW:         1.00 cm LV IVS:        0.90 cm LVOT diam:     1.70 cm LV SV:         39.84 ml LV SV Index:   19.70 LVOT Area:     2.27 cm  RIGHT VENTRICLE             IVC RV Basal diam:  2.50 cm     IVC diam: 2.00 cm RV Mid diam:    1.90 cm RV S prime:     12.40 cm/s TAPSE (M-mode): 2.0 cm LEFT ATRIUM             Index       RIGHT ATRIUM           Index LA diam:        4.60 cm 2.05 cm/m  RA Area:     14.30 cm LA Vol (A2C):   43.8 ml 19.49 ml/m RA Volume:   32.80 ml  14.60 ml/m LA Vol (A4C):   45.4 ml 20.20 ml/m LA Biplane Vol: 44.5 ml 19.80 ml/m  AORTIC VALVE LVOT Vmax:   93.75 cm/s LVOT Vmean:  67.350 cm/s LVOT VTI:    0.176 m  AORTA Ao Root diam: 3.00 cm Ao Asc diam:  2.80 cm MITRAL VALVE MV Area (PHT): 3.81 cm             SHUNTS MV Decel Time: 199 msec             Systemic VTI:  0.18 m MV E velocity: 132.50 cm/s 103 cm/s Systemic Diam: 1.70 cm Kristeen Miss MD Electronically signed by Kristeen Miss MD Signature Date/Time: 11/08/2019/2:26:37 PM    Final    VAS [place] LOWER EXTREMITY VENOUS (DVT)  Result Date: 11/08/2019  Lower Venous DVTStudy  Indications: Swelling.  Risk Factors: None identified. Limitations: Body habitus and poor ultrasound/tissue interface. Comparison Study: No prior studies. Performing Technologist: Chanda Busing RVT  Examination Guidelines: A complete evaluation includes B-mode imaging, spectral Doppler, color Doppler, and power Doppler as needed of all accessible portions of each vessel. Bilateral testing is considered an integral part of a complete examination. Limited examinations for reoccurring indications may be performed as noted. The reflux portion of the exam is performed with the patient in reverse Trendelenburg.  +---------+---------------+---------+-----------+----------+--------------+ RIGHT    CompressibilityPhasicitySpontaneityPropertiesThrombus Aging +---------+---------------+---------+-----------+----------+--------------+ CFV      Full           Yes      Yes                                 +---------+---------------+---------+-----------+----------+--------------+ SFJ      Full                                                        +---------+---------------+---------+-----------+----------+--------------+ FV Prox  Full                                                        +---------+---------------+---------+-----------+----------+--------------+  FV Mid   Full                                                        +---------+---------------+---------+-----------+----------+--------------+ FV DistalFull                                                        +---------+---------------+---------+-----------+----------+--------------+ PFV      Full                                                        +---------+---------------+---------+-----------+----------+--------------+ POP      Full           Yes      Yes                                 +---------+---------------+---------+-----------+----------+--------------+ PTV      Full                                                         +---------+---------------+---------+-----------+----------+--------------+ PERO     Full                                                        +---------+---------------+---------+-----------+----------+--------------+   +---------+---------------+---------+-----------+----------+--------------+ LEFT     CompressibilityPhasicitySpontaneityPropertiesThrombus Aging +---------+---------------+---------+-----------+----------+--------------+ CFV      Full           Yes      Yes                                 +---------+---------------+---------+-----------+----------+--------------+ SFJ      Full                                                        +---------+---------------+---------+-----------+----------+--------------+ FV Prox  Full                                                        +---------+---------------+---------+-----------+----------+--------------+ FV Mid   Full                                                        +---------+---------------+---------+-----------+----------+--------------+  FV DistalFull                                                        +---------+---------------+---------+-----------+----------+--------------+ PFV      Full                                                        +---------+---------------+---------+-----------+----------+--------------+ POP      Full           Yes      Yes                                 +---------+---------------+---------+-----------+----------+--------------+ PTV      Full                                                        +---------+---------------+---------+-----------+----------+--------------+ PERO     Full                                                        +---------+---------------+---------+-----------+----------+--------------+     Summary: RIGHT: - There is no evidence of deep vein thrombosis in the lower  extremity.  - No cystic structure found in the popliteal fossa.  LEFT: - There is no evidence of deep vein thrombosis in the lower extremity.  - No cystic structure found in the popliteal fossa.  *See table(s) above for measurements and observations. Electronically signed by Lemar Livings MD on 11/08/2019 at 6:38:12 PM.    Final     Cardiac Studies   TTE 11/07/18: 1. Left ventricular ejection fraction, by estimation, is 50 to 55%. The  left ventricle has low normal function. The left ventrical has no regional  wall motion abnormalities. Left ventricular diastolic parameters are  consistent with Grade II diastolic  dysfunction (pseudonormalization).  2. Right ventricular systolic function is normal. The right ventricular  size is normal.  3. The mitral valve is grossly normal. no evidence of mitral valve  regurgitation.  4. The aortic valve was not well visualized. Aortic valve regurgitation  is not visualized.   Patient Profile     86 year old female with history of hypertension, COPD, fibromyalgia, hypothyroidism who is admitted with acute hypoxic respiratory failure due to COPD exacerbation/pulmonary edema who we are seeing today for evaluation of atrial fibrillation  Assessment & Plan     New onset atrial fibrillation with rapid ventricular rate in setting of COPD exacerbation.  Asymptomatic. CHA2DS2-VASc 2 score of 4 for (agex2, female, hypertension).  Converted to NSR yesterday.  TTE showed EF 50-55%. -Discontinue short acting diltiazem and will start diltiazem CD 180 mg daily -Currently on Lovenox for anticoagulation in case inpatient work-up planned for spleen mass.  Would change to Eliquis 5 mg twice daily if no further work-up  planned as inpatient (dose would be 2.5 mg BID if creatinine>1.5 given her age)  Acute hypoxic respiratory failure secondary to pulmonary edema/COPD exacerbation -per primary team -On nebulizer, steroids  Hypertension: Blood pressure relatively  stable -Home amlodipine discontinued>>> switched to Cardizem for rate control  Spleen mass/mediasinal/hilar adenopathy - Per primary team  - Change anticoagulation to Eliquis when no further work-up planned   [facility] will sign off.   Medication Recommendations:  Discontinue home amlodipine and start diltiazem CD 180 mg.  Switch from lovenox to Eliquis as above once no further invasive work-up planned Other recommendations (labs, testing, etc):  None Follow up as an outpatient:  Will schedule outpatient follow-up  For questions or updates, please contact [facility] Please consult [email or web address] for contact info under      Signed, [place] Ish[place]awah[place] L Lennis Rader, MD  11/09/2019, 7:51 AM

## 2019-11-09 NOTE — Progress Notes (Signed)
PROGRESS NOTE  Angela Beasley DIY:[id number] DOB: [birth year]   PCP: Karle Plumber, MD  Patient is from: Home.  Lives with daughter and her family.  Wheelchair-bound at baseline.  DOA: 11/07/2019 LOS: 1  Brief Narrative / Interim history: 86 year old female with history of COPD not on oxygen, HTN, hypothyroidism, osteoarthritis, debility and morbid obesity presenting with dry cough, shortness of breath, wheeze, orthopnea and edema.  Reportedly saturating in 70s to 80s on RA at home.  In ED, afebrile.  In A. fib with mild RVR to 119.  92 to 98% on 3 L by [place].  CBC normal.  Creatinine 1.32.  BUN 29.  BNP 81.  High-sensitivity troponin within normal range.  D-dimer 1.9.  CTA chest negative for PE but concerning for CHF with bilateral pleural effusion, extensive mediastinal and hilar adenopathy, and heterogeneous lesion in the spleen measuring 3.8 cm.  Influenza PCR and COVID-19 negative. She was admitted for acute respiratory failure, A. fib with RVR, acute diastolic CHF and COPD exacerbation.  Started on Lasix, systemic steroid, breathing treatments, p.o. Cardizem and Lovenox.  Cardiology consulted.  Echocardiogram with EF of 50 to 55% and G2 DD but no other significant finding.  Subjective: No major events overnight or this morning.  Feels better.  Reports improvement in her breathing and cough but not quite close to her baseline.  Denies chest pain, palpitation, GI or UTI symptoms.  Objective: Vitals:   11/09/19 0755 11/09/19 0811 11/09/19 1224 11/09/19 1300  BP:  127/89 110/66   Pulse:  64    Resp:  16    Temp: 98 F (36.7 C) 98 F (36.7 C) 97.8 F (36.6 C)   TempSrc:  Oral Oral   SpO2: 94% 98%  95%  Weight:      Height:        Intake/Output Summary (Last 24 hours) at 11/09/2019 1314 Last data filed at 11/08/2019 1900 Gross per 24 hour  Intake --  Output 500 ml  Net -500 ml   Filed Weights   11/08/19 0434 11/08/19 1922  Weight: 113.4 kg 119 kg    Examination:  GENERAL:  No acute distress.  Appears well.  HEENT: MMM.  Vision and hearing grossly intact.  NECK: Supple.  No apparent JVD.  RESP: On 2 L by [place].  No IWOB.  Fair aeration bilaterally.  No wheeze or crackles. CVS:  RRR. Heart sounds normal.  ABD/GI/GU: Bowel sounds present. Soft. Non tender.  MSK/EXT:  Moves extremities. No apparent deformity. No edema.  SKIN: no apparent skin lesion or wound NEURO: Awake, alert and oriented appropriately.  No apparent focal neuro deficit. PSYCH: Calm. Normal affect.   Procedures:  None  Assessment & Plan: Acute respiratory failure with hypoxia: Multifactorial-A. fib with RVR, CHF and COPD.  She could have OSA/OHS as well.  Improved but requiring supplemental oxygen to maintain appropriate saturation. -Treat treatable causes -Wean oxygen as able-minimal oxygen to maintain sats above 90%.  -Wheelchair-bound at baseline-cannot ambulate for saturation assessment -Incentive spirometry and flutter valve -OOB/PT/OT.  New paroxysmal A. fib with RVR: Now rate controlled with Cardizem.  CHA2DS2-VASc score 5.  Echo with normal EF and G2 DD.  TSH normal. -Cardizem CD 180 mg daily per cardiology -Transition to Eliquis. -Cardiology to arrange outpatient follow-up.  Acute diastolic CHF: Echo with EF of 55 to 60% and G2 DD.  CTA chest revealed pulmonary edema, cardiomegaly and pleural effusion concerning for CHF.  Received IV Lasix 40 mg x 1 on admission.  INR incomplete.  Cr 1.22> 1.45. -Diuretics on hold. -Monitor fluid status, renal function and electrolytes. -Sodium and fluid restriction  COPD exacerbation: Not on oxygen at home.  Improving but still with supplemental oxygen requirement, cough and shortness of breath. -Continue Xopenex and Atrovent -Add Dulera and doxycycline -Continue prednisone -PPI for GI prophylaxis while on steroids.  Essential hypertension: Normotensive -Cardizem as above.  Elevated creatinine/azotemia: Cr 1.22 (admit)> 1.45.  Unknown  baseline.  On p.o. Voltaren at home.  Received IV Lasix x1 upon admission.  Could be hemodynamically mediated in the setting of A. fib. -Avoid nephrotoxic meds -Continue monitoring.  Debility: Wheelchair-bound at baseline. -PT/OT eval  Osteoarthritis -Scheduled Tylenol, gabapentin and as needed tramadol. -Avoid NSAIDs in the setting of CHF and possible AKI  Anxiety and depression -Continue home Celexa.  Hypothyroidism -Continue home Synthroid.  Elevated D-dimer: CTA chest negative for PE.  Lower extremity Dopplers negative for DVT.  Splenic mass/mediastinal and hilar adenopathy:  -Discussed with IR, Dr. Fredia Sorrow who believes the splenic mass is benign.  Suggested PET scan outpatient in the future for mediastinal and healer adenopathy   Pressure Injury 11/08/19 Buttocks Right;Left Stage 2 -  Partial thickness loss of dermis presenting as a shallow open injury with a red, pink wound bed without slough. (Active)  11/08/19 0630  Location: Buttocks  Location Orientation: Right;Left  Staging: Stage 2 -  Partial thickness loss of dermis presenting as a shallow open injury with a red, pink wound bed without slough.  Wound Description (Comments):   Present on Admission: Yes               DVT prophylaxis: On Eliquis for anticoagulation Code Status: Full code Family Communication: Patient and/or RN.  Attempted to call patient's daughter but no answer.  Discharge barrier: Respiratory failure requiring supplemental oxygen and AKI? Patient is from: Home Final disposition: Likely home with [facility] when medically stable  Consultants: Cardiology, IR (over the phone)   Microbiology summarized: COVID-19 negative. Influenza PCR negative.  Sch Meds:  Scheduled Meds: . calcium-vitamin D  1 tablet Oral Daily  . citalopram  40 mg Oral Daily  . diltiazem  180 mg Oral Daily  . enoxaparin (LOVENOX) injection  1 mg/kg Subcutaneous Q12H  . gabapentin  300 mg Oral TID  . guaiFENesin  600 mg  Oral BID  . influenza vaccine adjuvanted  0.5 mL Intramuscular Tomorrow-1000  . ipratropium  0.5 mg Nebulization TID  . levalbuterol  0.63 mg Nebulization TID  . levothyroxine  50 mcg Oral QAC breakfast  . loratadine  10 mg Oral Daily  . mometasone-formoterol  2 puff Inhalation BID  . mupirocin ointment  1 application Topical TID  . pantoprazole  40 mg Oral Daily  . pneumococcal 23 valent vaccine  0.5 mL Intramuscular Tomorrow-1000  . predniSONE  40 mg Oral Q breakfast  . sodium chloride flush  3 mL Intravenous Q12H   Continuous Infusions: . sodium chloride     PRN Meds:.sodium chloride, acetaminophen, albuterol, ondansetron (ZOFRAN) IV, sodium chloride flush, traMADol  Antimicrobials: Anti-infectives (From admission, onward)   None       I have personally reviewed the following labs and images: CBC: Recent Labs  Lab 11/07/19 2320  WBC 7.9  NEUTROABS 5.6  HGB 13.3  HCT 41.2  MCV 90.0  PLT 217   BMP &GFR Recent Labs  Lab 11/07/19 2320 11/08/19 0837 11/09/19 0131  NA 137  --  138  K 4.4  --  5.1  CL  104  --  103  CO2 25  --  22  GLUCOSE 93  --  121*  BUN 29*  --  38*  CREATININE 1.22*  --  1.45*  CALCIUM 8.7*  --  8.7*  MG  --  2.0  --    Estimated Creatinine Clearance: 36.5 mL/min (A) (by C-G formula based on SCr of 1.45 mg/dL (H)). Liver & Pancreas: Recent Labs  Lab 11/07/19 2320  AST 18  ALT 15  ALKPHOS 49  BILITOT 0.8  PROT 7.0  ALBUMIN 3.5   No results for input(s): LIPASE, AMYLASE in the last 168 hours. No results for input(s): AMMONIA in the last 168 hours. Diabetic: No results for input(s): HGBA1C in the last 72 hours. Recent Labs  Lab 11/08/19 2026  GLUCAP 142*   Cardiac Enzymes: No results for input(s): CKTOTAL, CKMB, CKMBINDEX, TROPONINI in the last 168 hours. No results for input(s): PROBNP in the last 8760 hours. Coagulation Profile: No results for input(s): INR, PROTIME in the last 168 hours. Thyroid Function Tests: Recent  Labs    11/08/19 0837  TSH 3.268   Lipid Profile: No results for input(s): CHOL, HDL, LDLCALC, TRIG, CHOLHDL, LDLDIRECT in the last 72 hours. Anemia Panel: No results for input(s): VITAMINB12, FOLATE, FERRITIN, TIBC, IRON, RETICCTPCT in the last 72 hours. Urine analysis: No results found for: COLORURINE, APPEARANCEUR, LABSPEC, PHURINE, GLUCOSEU, HGBUR, BILIRUBINUR, KETONESUR, PROTEINUR, UROBILINOGEN, NITRITE, LEUKOCYTESUR Sepsis Labs: Invalid input(s): PROCALCITONIN, LACTICIDVEN  Microbiology: Recent Results (from the past 240 hour(s))  Respiratory Panel by RT PCR (Flu A&B, Covid) - Nasopharyngeal Swab     Status: None   Collection Time: 11/08/19  1:00 AM   Specimen: Nasopharyngeal Swab  Result Value Ref Range Status   SARS Coronavirus 2 by RT PCR NEGATIVE NEGATIVE Final    Comment: (NOTE) SARS-CoV-2 target nucleic acids are NOT DETECTED. The SARS-CoV-2 RNA is generally detectable in upper respiratoy specimens during the acute phase of infection. The lowest concentration of SARS-CoV-2 viral copies this assay can detect is 131 copies/mL. A negative result does not preclude SARS-Cov-2 infection and should not be used as the sole basis for treatment or other patient management decisions. A negative result may occur with  improper specimen collection/handling, submission of specimen other than nasopharyngeal swab, presence of viral mutation(s) within the areas targeted by this assay, and inadequate number of viral copies (<131 copies/mL). A negative result must be combined with clinical observations, patient history, and epidemiological information. The expected result is Negative. Fact Sheet for Patients:  https://www.moore.com/ Fact Sheet for Healthcare Providers:  https://www.young.biz/ This test is not yet ap proved or cleared by the [place] FDA and  has been authorized for detection and/or diagnosis of SARS-CoV-2 by FDA under an  Emergency Use Authorization (EUA). This EUA will remain  in effect (meaning this test can be used) for the duration of the COVID-19 declaration under Section 564(b)(1) of the Act, 21 U.S.C. section 360bbb-3(b)(1), unless the authorization is terminated or revoked sooner.    Influenza A by PCR NEGATIVE NEGATIVE Final   Influenza B by PCR NEGATIVE NEGATIVE Final    Comment: (NOTE) The Xpert Xpress SARS-CoV-2/FLU/RSV assay is intended as an aid in  the diagnosis of influenza from Nasopharyngeal swab specimens and  should not be used as a sole basis for treatment. Nasal washings and  aspirates are unacceptable for Xpert Xpress SARS-CoV-2/FLU/RSV  testing. Fact Sheet for Patients: https://www.moore.com/ Fact Sheet for Healthcare Providers: https://www.young.biz/ This test is not yet approved  or cleared by the [place] and  has been authorized for detection and/or diagnosis of SARS-CoV-2 by  FDA under an Emergency Use Authorization (EUA). This EUA will remain  in effect (meaning this test can be used) for the duration of the  Covid-19 declaration under Section 564(b)(1) of the Act, 21  U.S.C. section 360bbb-3(b)(1), unless the authorization is  terminated or revoked. Performed at [facility], [place] [address] [place], [place] [address]     Radiology Studies: No results found.  45 minutes with more than 50% spent in reviewing records, counseling patient/family and coordinating care.   Catarina Huntley T. [place]  If 7PM-7AM, please contact night-coverage [email or web address] Password [facility] 11/09/2019, 1:14 PM

## 2019-11-10 DIAGNOSIS — I517 Cardiomegaly: Secondary | ICD-10-CM

## 2019-11-10 DIAGNOSIS — R590 Localized enlarged lymph nodes: Secondary | ICD-10-CM

## 2019-11-10 LAB — RENAL FUNCTION PANEL
Albumin: 3 g/dL — ABNORMAL LOW (ref 3.5–5.0)
Anion gap: 9 (ref 5–15)
BUN: 40 mg/dL — ABNORMAL HIGH (ref 8–23)
CO2: 27 mmol/L (ref 22–32)
Calcium: 8.7 mg/dL — ABNORMAL LOW (ref 8.9–10.3)
Chloride: 103 mmol/L (ref 98–111)
Creatinine, Ser: 1.45 mg/dL — ABNORMAL HIGH (ref 0.44–1.00)
GFR calc Af Amer: 37 mL/min — ABNORMAL LOW
GFR calc non Af Amer: 32 mL/min — ABNORMAL LOW
Glucose, Bld: 113 mg/dL — ABNORMAL HIGH (ref 70–99)
Phosphorus: 3.3 mg/dL (ref 2.5–4.6)
Potassium: 4.1 mmol/L (ref 3.5–5.1)
Sodium: 139 mmol/L (ref 135–145)

## 2019-11-10 LAB — CBC
HCT: 36 % (ref 36.0–46.0)
Hemoglobin: 12 g/dL (ref 12.0–15.0)
MCH: 29.5 pg (ref 26.0–34.0)
MCHC: 33.3 g/dL (ref 30.0–36.0)
MCV: 88.5 fL (ref 80.0–100.0)
Platelets: 236 K/uL (ref 150–400)
RBC: 4.07 MIL/uL (ref 3.87–5.11)
RDW: 12.6 % (ref 11.5–15.5)
WBC: 10.9 K/uL — ABNORMAL HIGH (ref 4.0–10.5)
nRBC: 0 % (ref 0.0–0.2)

## 2019-11-10 LAB — MAGNESIUM: Magnesium: 2 mg/dL (ref 1.7–2.4)

## 2019-11-10 MED ORDER — DOXYCYCLINE HYCLATE 100 MG PO TABS: 100.0000 mg | ORAL_TABLET | Freq: Two times a day (BID) | ORAL | 0 refills | Status: AC

## 2019-11-10 MED ORDER — PREDNISONE 20 MG PO TABS: 40.0000 mg | ORAL_TABLET | Freq: Every day | ORAL | 0 refills | Status: AC

## 2019-11-10 MED ORDER — LEVALBUTEROL HCL 0.63 MG/3ML IN NEBU: 0.6300 mg | INHALATION_SOLUTION | Freq: Four times a day (QID) | RESPIRATORY_TRACT | 12 refills | Status: AC | PRN

## 2019-11-10 MED ORDER — APIXABAN 5 MG PO TABS: 5.0000 mg | ORAL_TABLET | Freq: Two times a day (BID) | ORAL | 1 refills | Status: AC

## 2019-11-10 MED ORDER — GUAIFENESIN ER 600 MG PO TB12: 600.0000 mg | ORAL_TABLET | Freq: Two times a day (BID) | ORAL | Status: AC

## 2019-11-10 MED ORDER — IPRATROPIUM BROMIDE 0.02 % IN SOLN: 0.5000 mg | Freq: Two times a day (BID) | RESPIRATORY_TRACT | Status: DC

## 2019-11-10 MED ORDER — FUROSEMIDE 40 MG PO TABS: 40.0000 mg | ORAL_TABLET | Freq: Every day | ORAL | 1 refills | Status: AC | PRN

## 2019-11-10 MED ORDER — ANORO ELLIPTA 62.5-25 MCG/INH IN AEPB: 1.0000 | INHALATION_SPRAY | Freq: Every day | RESPIRATORY_TRACT | 1 refills | Status: AC

## 2019-11-10 MED ORDER — DILTIAZEM HCL ER COATED BEADS 180 MG PO CP24: 180.0000 mg | ORAL_CAPSULE | Freq: Every day | ORAL | 1 refills | Status: AC

## 2019-11-10 MED ORDER — LEVALBUTEROL HCL 0.63 MG/3ML IN NEBU: 0.6300 mg | INHALATION_SOLUTION | Freq: Two times a day (BID) | RESPIRATORY_TRACT | Status: DC

## 2019-11-10 MED ORDER — ACETAMINOPHEN 500 MG PO TABS: 1000.0000 mg | ORAL_TABLET | Freq: Three times a day (TID) | ORAL | 1 refills | Status: AC

## 2019-11-10 NOTE — Discharge Instructions (Signed)

## 2019-11-10 NOTE — Progress Notes (Signed)
New order to discharge patient.  Cardiac monitoring discontinued.  PIV discontinued.  AVS completed, printed, reviewed with patinet and her daughter, then given to patient's daughter.  All questions answered.  Patient transported to main entrance by NT via wheelchair.

## 2019-11-10 NOTE — Progress Notes (Signed)
Physical Therapy Treatment Patient Details Name: Angela Beasley MRN: [id number] DOB: [birth year] Today's Date: 11/10/2019    History of Present Illness Pt is an 86 y.o. female admitted 11/07/19 with c/o cough and SOB; found to be hypoxic. Chest CT angiogram revealed no PE, concern for pulmonary edema/possible superimposed infection, bilateral pleural effusions, extensive mediastinal and hilar adenopathy, heterogeneous lesion in the spleen measuring 3.8 cm, and signs suggestive of esophagitis.TTE showed EF 50-55%. PMH includes COPD, HTN, fibromyalgia, arthritis; pt primarily uses w/c.    PT Comments    Pt progressing steadily towards her physical therapy goals. Requiring min assist for transfers, ambulating 5 feet with a walker at a min guard assist level. HR to 133 bpm, SpO2 90-91% on RA. Rest of session focused on seated exercises for lower extremity strengthening. Pt displays decreased endurance, generalized weakness and balance impairments. Would benefit from HHPT at discharge.    Follow Up Recommendations  Home health PT;Supervision - Intermittent     Equipment Recommendations  None recommended by PT    Recommendations for Other Services       Precautions / Restrictions Precautions Precautions: Fall Restrictions Weight Bearing Restrictions: No    Mobility  Bed Mobility Overal bed mobility: Needs Assistance Bed Mobility: Supine to Sit Rolling: Min guard         General bed mobility comments: Min guard to progress to sitting to edge of bed  Transfers Overall transfer level: Needs assistance Equipment used: Rolling walker (2 wheeled) Transfers: Sit to/from [employer] Sit to Stand: Min assist         General transfer comment: MinA to power up to standing from edge of bed. Min guard for transfer to recliner  Ambulation/Gait Ambulation/Gait assistance: Min guard Gait Distance (Feet): 5 Feet Assistive device: Rolling walker (2 wheeled) Gait  Pattern/deviations: Step-through pattern;Decreased stride length;Trunk flexed Gait velocity: decreased Gait velocity interpretation: <1.8 ft/sec, indicate of risk for recurrent falls General Gait Details: Slow and effortful, requiring min guard assist for stability. No overt LOB   Stairs             Wheelchair Mobility    Modified Rankin (Stroke Patients Only)       Balance Overall balance assessment: Needs assistance Sitting-balance support: No upper extremity supported;Feet supported Sitting balance-Leahy Scale: Fair     Standing balance support: Bilateral upper extremity supported;During functional activity Standing balance-Leahy Scale: Poor Standing balance comment: reliant on external support                            Cognition Arousal/Alertness: Awake/alert Behavior During Therapy: WFL for tasks assessed/performed Overall Cognitive Status: Within Functional Limits for tasks assessed                                        Exercises General Exercises - Lower Extremity Long Arc Quad: Both;10 reps;Seated Hip ABduction/ADduction: Both;10 reps;Seated Hip Flexion/Marching: Both;10 reps;Seated    General Comments        Pertinent Vitals/Pain Pain Assessment: No/denies pain    Home Living                      Prior Function            PT Goals (current goals can now be found in the care plan section) Acute Rehab PT Goals Patient Stated Goal: "walk  around household distance." Potential to Achieve Goals: Good Progress towards PT goals: Progressing toward goals    Frequency    Min 3X/week      PT Plan Current plan remains appropriate    Co-evaluation              AM-PAC PT "6 Clicks" Mobility   Outcome Measure  Help needed turning from your back to your side while in a flat bed without using bedrails?: None Help needed moving from lying on your back to sitting on the side of a flat bed without using  bedrails?: A Little Help needed moving to and from a bed to a chair (including a wheelchair)?: A Little Help needed standing up from a chair using your arms (e.g., wheelchair or bedside chair)?: A Little Help needed to walk in hospital room?: A Little Help needed climbing 3-5 steps with a railing? : A Lot 6 Click Score: 18    End of Session Equipment Utilized During Treatment: Gait belt Activity Tolerance: Patient tolerated treatment well Patient left: in chair;with call bell/phone within reach;with chair alarm set Nurse Communication: Mobility status PT Visit Diagnosis: Other abnormalities of gait and mobility (R26.89)     Time: 7169-6789 PT Time Calculation (min) (ACUTE ONLY): 32 min  Charges:  $Therapeutic Exercise: 8-22 mins $Therapeutic Activity: 8-22 mins                       Angela Beasley, PT, DPT Acute Rehabilitation Services Pager [phone] Office [phone]    Angela Beasley 11/10/2019, 12:21 PM

## 2019-11-10 NOTE — Progress Notes (Signed)
Patient refused flu and Pneumonia vaccines.  Stated she will get them at her PCP office next week.

## 2019-11-10 NOTE — TOC Transition Note (Signed)
Transition of Care [facility]) - CM/SW Discharge Note   Patient Details  Name: Angela Beasley MRN: [id number] Date of Birth: 1930-09-16  [facility] CM/SW Contact:  Lawerance Sabal, RN Phone Number: 11/10/2019, 11:18 AM   Clinical Narrative:    Spoke to patient and she is agreeable to [facility] services through any provider that will accept her insurance. We discussed ratings. [facility] able to accept and is aware to call daughter per patient request to schedule home visits. No DME needs, no other CM needs identified.     Final next level of care: Home w Home Health Services Barriers to Discharge: No Barriers Identified   Patient Goals and CMS Choice Patient states their goals for this hospitalization and ongoing recovery are:: to go home CMS Medicare.gov Compare Post Acute Care list provided to:: Patient Choice offered to / list presented to : Patient  Discharge Placement                       Discharge Plan and Services                          HH Arranged: PT, OT, Nurse's Aide HH Agency: [facility] Date [facility] Agency Contacted: 11/10/19 Time HH Agency Contacted: 1118 Representative spoke with at [facility] Agency: Barbara Cower  Social Determinants of Health (SDOH) Interventions     Readmission Risk Interventions No flowsheet data found.

## 2019-11-10 NOTE — Progress Notes (Signed)
Occupational Therapy Treatment Patient Details Name: Angela Beasley MRN: [id number] DOB: August 16, 1930 Today's Date: 11/10/2019    History of present illness Pt is an 86 y.o. female admitted 11/07/19 with c/o cough and SOB; found to be hypoxic. Chest CT angiogram revealed no PE, concern for pulmonary edema/possible superimposed infection, bilateral pleural effusions, extensive mediastinal and hilar adenopathy, heterogeneous lesion in the spleen measuring 3.8 cm, and signs suggestive of esophagitis.TTE showed EF 50-55%. PMH includes COPD, HTN, fibromyalgia, arthritis; pt primarily uses w/c.   OT comments  Pt making gradual progress towards OT goals this session. Pt declined OOB txfr this session but reports incontinent episode needing assist with pericare and changing bed sheets. Pt completed bed mobility via rolling to assist with pericare and changing bed linens. Pt required total A for pericare d/t cleanliness. Pt able to pull trunk forward from long sitting to assist with changing sheets with supervision. Pt reports successful outcome with trialing AE for self- feeding. Pt on RA throughout session with sats >90%. DC plan remains appropriate, will follow acutely per POC.     Follow Up Recommendations  Home health OT;Supervision - Intermittent    Equipment Recommendations  None recommended by OT    Recommendations for Other Services      Precautions / Restrictions Precautions Precautions: Fall Restrictions Weight Bearing Restrictions: No       Mobility Bed Mobility Overal bed mobility: Needs Assistance Bed Mobility: Rolling Rolling: Supervision         General bed mobility comments: pt able to roll R/L with supervision with use of bed rails; pt able to use BUEs to pull trunk forward with supervision; MAX +2 to scoot pt up in bed for repositioning  Transfers                 General transfer comment: pt declined OOB txfr    Balance                                            ADL either performed or assessed with clinical judgement   ADL Overall ADL's : Needs assistance/impaired Eating/Feeding: Set up;Sitting;With adaptive utensils Eating/Feeding Details (indicate cue type and reason): pt reports able to eat better with weighted spoon; unable to trial plateguard but feels it will be benefical at home Grooming: Set up;Sitting       Lower Body Bathing: Total assistance;Bed level Lower Body Bathing Details (indicate cue type and reason): d/t incontinent episode           Toilet Transfer Details (indicate cue type and reason): pt declined OOb txfr Toileting- Clothing Manipulation and Hygiene: Maximal assistance;Bed level     Tub/Shower Transfer Details (indicate cue type and reason): reports sponge baths at baseline Functional mobility during ADLs: Supervision/safety;Min guard;Maximal assistance(bed mobility only) General ADL Comments: pt completed bed mobility to assist with pericare after incontinent episode; pt declined further ADLs or SPT to recliner     Vision       Perception     Praxis      Cognition Arousal/Alertness: Awake/alert Behavior During Therapy: WFL for tasks assessed/performed Overall Cognitive Status: Within Functional Limits for tasks assessed  Exercises     Shoulder Instructions       General Comments      Pertinent Vitals/ Pain       Pain Assessment: No/denies pain  Home Living                                          Prior Functioning/Environment              Frequency  Min 2X/week        Progress Toward Goals  OT Goals(current goals can now be found in the care plan section)  Progress towards OT goals: Progressing toward goals  Acute Rehab OT Goals Patient Stated Goal: move better, improve mobility OT Goal Formulation: With patient Time For Goal Achievement: 11/23/19 Potential to Achieve Goals: Good   Plan Discharge plan remains appropriate    Co-evaluation                 AM-PAC OT "6 Clicks" Daily Activity     Outcome Measure   Help from another person eating meals?: A Little Help from another person taking care of personal grooming?: A Little Help from another person toileting, which includes using toliet, bedpan, or urinal?: A Little Help from another person bathing (including washing, rinsing, drying)?: A Little Help from another person to put on and taking off regular upper body clothing?: None Help from another person to put on and taking off regular lower body clothing?: A Little 6 Click Score: 19    End of Session Equipment Utilized During Treatment: Oxygen;Other (comment)(1L O2)  OT Visit Diagnosis: Unsteadiness on feet (R26.81);Muscle weakness (generalized) (M62.81);Feeding difficulties (R63.3)   Activity Tolerance Patient tolerated treatment well   Patient Left in bed;with call bell/phone within reach;with nursing/sitter in room   Nurse Communication Mobility status;Other (comment)(need new purewick)        Time: 6283-1517 OT Time Calculation (min): 31 min  Charges: OT General Charges $OT Visit: 1 Visit OT Treatments $Self Care/Home Management : 23-37 mins  Audery Amel., COTA/L Acute Rehabilitation Services [phone]    Angelina Pih 11/10/2019, 9:19 AM

## 2019-11-10 NOTE — Discharge Summary (Signed)
Physician Discharge Summary  Angela Lowancy Matherly WJX:[id number] DOB: [birth year] DOA: 11/07/2019  PCP: Karle PlumberArvind, Moogali M, MD  Admit date: 11/07/2019 Discharge date: 11/10/2019  Admitted From: Home Disposition: Home  Recommendations for Outpatient Follow-up:  1. Follow ups as below. 2. Please obtain CBC/BMP/Mag at follow up 3. IR suggested PET scan about her hilar and mediastinal lymphadenopathy 4. Patient would benefit from sleep study 5. Please follow up on the following pending results: None  Home Health: PT/OT Equipment/Devices: Patient has a wheelchair at home.  Discharge Condition: Stable CODE STATUS: Full code  Follow-up Information    Little IshikawaSchumann, Christopher L, MD Follow up on 12/10/2019.   Specialty: Cardiology Why: Please go to hospital follow up March 15th at [facility] Contact information: [address] [place] [address] [phone]        Karle PlumberArvind, Moogali M, MD. Schedule an appointment as soon as possible for a visit in 1 week(s).   Specialty: Internal Medicine Contact information: [phone] [address] [place] [address] [phone]           Hospital Course: 86 year old female with history of COPD not on oxygen, HTN, hypothyroidism, osteoarthritis, debility and morbid obesity presenting with dry cough, shortness of breath, wheeze, orthopnea and edema.  Reportedly saturating in 70s to 80s on RA at home.  In ED, afebrile.  In A. fib with mild RVR to 119.  92 to 98% on 3 L by Espy.  CBC normal.  Creatinine 1.32.  BUN 29.  BNP 81.  High-sensitivity troponin within normal range.  D-dimer 1.9.  CTA chest negative for PE but concerning for CHF with bilateral pleural effusion, extensive mediastinal and hilar adenopathy, and heterogeneous lesion in the spleen measuring 3.8 cm.  Influenza PCR and COVID-19 negative. She was admitted for acute respiratory failure, A. fib with RVR, acute diastolic CHF and COPD exacerbation.  Started on Lasix, systemic steroid, breathing  treatments, p.o. Cardizem and Lovenox.  Cardiology consulted.  Echocardiogram with EF of 50 to 55% and G2 DD but no other significant finding.  Lasix discontinued due to uptrending creatinine.  Continued on COPD medications and Cardizem.  Transitioned to Eliquis for anticoagulation.  On the day of discharge, she felt well and back to her baseline. She was weaned to room air and maintained saturation above 91%.  Ambulatory saturation assessment was not done as patient is wheelchair-bound.  Home health PT/OT ordered as recommended by PT/OT.  See individual problem list below for more on hospital course.  Discharge Diagnoses:  Acute respiratory failure with hypoxia: Multifactorial-A. fib with RVR, CHF and COPD.  She could have OSA/OHS as well.  Weaned to room air and maintained saturation greater than 91%.  She feels she is back to baseline. -Wheelchair-bound at baseline-cannot ambulate for saturation assessment -Encourage incentive spirometry at home -Treatment for COPD, CHF and atrial fibrillation as below. -would benefit from sleep study.  New paroxysmal A. fib with RVR: Now rate controlled with Cardizem.  CHA2DS2-VASc score 5.  Echo with normal EF and G2 DD.  TSH normal. -Cardizem CD 180 mg daily per cardiology -Eliquis 5 mg twice daily for anticoagulation. -Cardiology to arrange outpatient follow-up.  Acute diastolic CHF: Echo with EF of 55 to 60% and G2 DD.  CTA chest revealed pulmonary edema, cardiomegaly and pleural effusion concerning for CHF.  Received IV Lasix 40 mg x 1 on admission.  I&O incomplete.  Cr 1.22> 1.45> 1.45 (plateaued) -P.o. Lasix 40 mg as needed-to start on 2/15. -Discontinued home NSAIDs. -Recheck  renal function at follow-up -Counseled on fluid and sodium restrictions.  COPD exacerbation: Improved.  On room air.  Significant smoking history. -Discharged on prednisone and doxycycline to complete 5 days course -Added Anoro Ellipta -Already on Xopenex nebulizer  as needed  Essential hypertension: Normotensive -Cardizem and Lasix as above -Discontinued home amlodipine.  Elevated creatinine/azotemia: Cr 1.22 (admit)> 1.45> 1.45.  Unknown baseline.  -Discontinued home p.o. diclofenac -Recheck in 1 to 2 weeks.  Debility: Wheelchair-bound at baseline. -Home health PT/OT  Osteoarthritis -Scheduled Tylenol, gabapentin and as needed tramadol. -Discontinued home diclofenac.  Anxiety and depression -Continue home Celexa.  Hypothyroidism -Continue home Synthroid.  Elevated D-dimer: CTA chest negative for PE.  Lower extremity Dopplers negative for DVT.  Splenic mass/mediastinal and hilar adenopathy:  -Discussed with IR, Dr. Fredia Sorrow who believes the splenic mass is benign. He suggested PET scan outpatient in the future for mediastinal and healer adenopathy   Pressure Injury 11/08/19 Buttocks Right;Left Stage 2 -  Partial thickness loss of dermis presenting as a shallow open injury with a red, pink wound bed without slough. (Active)  11/08/19 0630  Location: Buttocks  Location Orientation: Right;Left  Staging: Stage 2 -  Partial thickness loss of dermis presenting as a shallow open injury with a red, pink wound bed without slough.  Wound Description (Comments):   Present on Admission: Yes   Family communication -Was not able to get in touch with patient's daughter over the phone on 2/12 and 2/13.   Discharge Instructions  Discharge Instructions    (HEART FAILURE PATIENTS) Call MD:  Anytime you have any of the following symptoms: 1) 3 pound weight gain in 24 hours or 5 pounds in 1 week 2) shortness of breath, with or without a dry hacking cough 3) swelling in the hands, feet or stomach 4) if you have to sleep on extra pillows at night in order to breathe.   Complete by: As directed    Call MD for:  difficulty breathing, headache or visual disturbances   Complete by: As directed    Call MD for:  extreme fatigue   Complete by: As  directed    Call MD for:  persistant dizziness or light-headedness   Complete by: As directed    Call MD for:  persistant nausea and vomiting   Complete by: As directed    Diet - low sodium heart healthy   Complete by: As directed    Discharge instructions   Complete by: As directed    It has been a pleasure taking care of you! You were hospitalized with shortness of breath, cough, wheeze and leg swelling.  You were treated for atrial fibrillation, COPD exacerbation and congestive heart failure.  Your symptoms improved to the point we think it is safe to let you go home and follow-up with your primary care doctor.  Cardiology (heart doctors) will arrange outpatient follow-up.  We have added new medications and made some adjustments to your home medications.   We encourage you fluid intake to less than 1500 cc (6 cups) a day and your sodium intake to less than 2000 mg (2 g) a day. We also recommend avoiding any pain medications over-the-counter other than plain Tylenol.  Please review your new medication list and the directions before you take your medications.  Please call your primary care office as soon as possible to schedule hospital follow-up in 1 to 2 weeks.   Take care,   Increase activity slowly   Complete by: As directed  Allergies as of 11/10/2019      Reactions   Lisinopril Swelling   Phenobarbital       Medication List    STOP taking these medications   amLODipine 10 MG tablet Commonly known as: NORVASC   azithromycin 250 MG tablet Commonly known as: ZITHROMAX   diclofenac 75 MG EC tablet Commonly known as: VOLTAREN     TAKE these medications   acetaminophen 500 MG tablet Commonly known as: TYLENOL Take 2 tablets (1,000 mg total) by mouth every 8 (eight) hours.   Anoro Ellipta 62.5-25 MCG/INH Aepb Generic drug: umeclidinium-vilanterol Inhale 1 puff into the lungs daily.   apixaban 5 MG Tabs tablet Commonly known as: ELIQUIS Take 1 tablet (5 mg  total) by mouth 2 (two) times daily.   CALTRATE 600+D3 PO Take 1 tablet by mouth daily.   citalopram 40 MG tablet Commonly known as: CELEXA Take 40 mg by mouth daily.   diltiazem 180 MG 24 hr capsule Commonly known as: CARDIZEM CD Take 1 capsule (180 mg total) by mouth daily. Start taking on: November 11, 2019   doxycycline 100 MG tablet Commonly known as: VIBRA-TABS Take 1 tablet (100 mg total) by mouth every 12 (twelve) hours.   furosemide 40 MG tablet Commonly known as: Lasix Take 1 tablet (40 mg total) by mouth daily as needed for fluid or edema. Start taking on: November 12, 2019   gabapentin 300 MG capsule Commonly known as: NEURONTIN Take 300 mg by mouth 3 (three) times daily.   guaiFENesin 600 MG 12 hr tablet Commonly known as: MUCINEX Take 1 tablet (600 mg total) by mouth 2 (two) times daily.   levalbuterol 0.63 MG/3ML nebulizer solution Commonly known as: XOPENEX Take 3 mLs (0.63 mg total) by nebulization every 6 (six) hours as needed for wheezing or shortness of breath. What changed:   how much to take  when to take this  reasons to take this   levothyroxine 50 MCG tablet Commonly known as: SYNTHROID Take 50 mcg by mouth daily.   loratadine 10 MG tablet Commonly known as: CLARITIN Take 10 mg by mouth daily.   Multi For Her 50+ Tabs Take 1 tablet by mouth daily.   mupirocin ointment 2 % Commonly known as: BACTROBAN Apply 1 application topically 3 (three) times daily.   predniSONE 20 MG tablet Commonly known as: DELTASONE Take 2 tablets (40 mg total) by mouth daily with breakfast. Start taking on: November 11, 2019   traMADol 50 MG tablet Commonly known as: ULTRAM Take 50 mg by mouth every 8 (eight) hours as needed for moderate pain.       Consultations:  Cardiology  Procedures/Studies:  2D Echo on 2/11 1. Left ventricular ejection fraction, by estimation, is 50 to 55%. The  left ventricle has low normal function. The left  ventrical has no regional  wall motion abnormalities. Left ventricular diastolic parameters are  consistent with Grade II diastolic  dysfunction (pseudonormalization).  2. Right ventricular systolic function is normal. The right ventricular  size is normal.  3. The mitral valve is grossly normal. no evidence of mitral valve  regurgitation.  4. The aortic valve was not well visualized. Aortic valve regurgitation is not visualized.    CT Angio Chest PE W and/or Wo Contrast  Result Date: 11/08/2019 CLINICAL DATA:  Shortness of breath, elevated D-dimer EXAM: CT ANGIOGRAPHY CHEST WITH CONTRAST TECHNIQUE: Multidetector CT imaging of the chest was performed using the standard protocol during bolus administration of intravenous contrast.  Multiplanar CT image reconstructions and MIPs were obtained to evaluate the vascular anatomy. CONTRAST:  11mL OMNIPAQUE IOHEXOL 350 MG/ML SOLN COMPARISON:  Chest radiograph 11/07/2019 FINDINGS: Cardiovascular: Satisfactory opacification the pulmonary arteries to the segmental level. No pulmonary artery filling defects are identified. Central pulmonary arteries are borderline enlarged but this finding is favored to reflect chronic pulmonary artery hypertension in the absence of filling defects. Normal cardiac size. Coronary artery calcifications are present. Dense calcification of the mitral annulus. No pericardial effusion. Normal physiologic fluid in the pericardial recesses. Major venous structures are unremarkable. Mediastinum/Nodes: There is extensive mediastinal and hilar adenopathy. A larger nodal conglomerate is seen in the subcarinal space measuring up to 23 mm (4/52). Enlarged 12 mm right hilar node (4/59) as well as several borderline enlarged AP window lymph nodes including a 16 mm node (4/45). No acute abnormality of the trachea. Mild nonspecific thickening of the thoracic esophagus. Lungs/Pleura: There is extensive interlobular septal thickening with some  interspersed areas of mosaic attenuation and patchy ground-glass. Few multifocal more consolidative airspace opacities are noted as well. Largest focal consolidation seen in right lung base (4/81) measuring up to 2 cm in size. Small bilateral effusions, right slightly greater than left with adjacent passive atelectasis. Upper Abdomen: Heterogeneous lesion seen in the spleen measuring up to 3.8 cm in size. Some irregular internal calcification is noted. Slight left lobe hypertrophy of the liver. Upper abdomen is otherwise unremarkable. Circumferential body wall edema is noted. Musculoskeletal: Multilevel degenerative changes are present in the imaged portions of the spine. No suspicious chest wall lesions. Review of the MIP images confirms the above findings. IMPRESSION: 1. No evidence of acute pulmonary embolism. 2. Central pulmonary arteries are borderline enlarged but this finding is favored to reflect chronic pulmonary artery hypertension in the absence of filling defects. 3. Extensive interlobular septal thickening with some interspersed areas of mosaic attenuation and patchy ground-glass. Few multifocal more consolidative airspace opacities are noted as well. These findings are favored to represent pulmonary edema, however there are features suggesting superimposed infection as well with a more consolidated 2 cm opacity in the right lung base. Consider 6-8 week follow-up imaging to assess for resolution. 4. Small bilateral pleural effusions, right slightly greater than left. Additional body wall edema may reflect early anasarca. 5. Extensive mediastinal and hilar adenopathy. Differential could include either reactive or edematous nodes given setting of edema and likely infection however given the splenic mass detailed below, malignancy is not fully excluded. Could also be re-evaluated on follow-up imaging. 6. Heterogeneous lesion in the spleen measuring up to 3.8 cm in size with irregular internal  calcification. This finding is nonspecific and could represent a benign lesion such as hemangioma, however other etiologies are not excluded. Consider further evaluation dedicated splenic MRI with contrast on a nonemergent outpatient basis for best imaging quality. This recommendation follows ACR consensus guidelines: White Paper of the ACR Incidental Findings Committee II on Splenic and Nodal Findings. J Am Coll Radiol 2013;10:789-794. 7. Mild nonspecific thickening of the thoracic esophagus. Correlate for features of esophagitis. 8. Slight left lobe hypertrophy of the liver. Correlate with any history of liver disease or risk factors for cirrhosis. These results were called by telephone at the time of interpretation on 11/08/2019 at 1:26 am to provider [facility] , who verbally acknowledged these results. Electronically Signed   By: Kreg Shropshire M.D.   On: 11/08/2019 01:26   DG Chest Portable 1 View  Result Date: 11/07/2019 CLINICAL DATA:  Shortness of breath  cough EXAM: PORTABLE CHEST 1 VIEW COMPARISON:  July 30, 2016 FINDINGS: The heart size and mediastinal contours are unchanged with cardiomegaly. Aortic knob calcifications. Mildly increased interstitial markings are seen throughout both lungs, predominantly at both lung bases. There is prominence of the central pulmonary vasculature. No pleural effusion is seen. No acute osseous abnormality. IMPRESSION: Diffusely increased interstitial opacities, predominantly at both lung bases, likely due to interstitial edema. Mild cardiomegaly Electronically Signed   By: Jonna ClarkBindu  Avutu M.D.   On: 11/07/2019 23:06   ECHOCARDIOGRAM COMPLETE  Result Date: 11/08/2019    ECHOCARDIOGRAM REPORT   Patient Name:   Angela Beasley Date of Exam: 11/08/2019 Medical Rec #:  [id number]    Height:       68.0 in Accession #:    [id number]   Weight:       250.0 lb Date of Birth:  1930/06/14    BSA:          2.25 m Patient Age:    86 years     BP:           143/70 mmHg Patient Gender: F             HR:           112 bpm. Exam Location:  Inpatient Procedure: 2D Echo, Cardiac Doppler and Color Doppler Indications:    I50.33 Acute on chronic diastolic (congestive) heart failure  History:        Patient has no prior history of Echocardiogram examinations.                 COPD; Risk Factors:Hypertension. Hypothyroidism.  Sonographer:    Tiffany Dance Referring Phys: [id number] RONDELL A SMITH IMPRESSIONS  1. Left ventricular ejection fraction, by estimation, is 50 to 55%. The left ventricle has low normal function. The left ventrical has no regional wall motion abnormalities. Left ventricular diastolic parameters are consistent with Grade II diastolic dysfunction (pseudonormalization).  2. Right ventricular systolic function is normal. The right ventricular size is normal.  3. The mitral valve is grossly normal. no evidence of mitral valve regurgitation.  4. The aortic valve was not well visualized. Aortic valve regurgitation is not visualized. FINDINGS  Left Ventricle: Left ventricular ejection fraction, by estimation, is 50 to 55%. The left ventricle has low normal function. The left ventricle has no regional wall motion abnormalities. There is no left ventricular hypertrophy. Left ventricular diastolic parameters are consistent with Grade II diastolic dysfunction (pseudonormalization). Right Ventricle: The right ventricular size is normal. No increase in right ventricular wall thickness. Right ventricular systolic function is normal. Left Atrium: Left atrial size was normal in size. Right Atrium: Right atrial size was normal in size. Pericardium: There is no evidence of pericardial effusion. Mitral Valve: The mitral valve is grossly normal. Moderate mitral annular calcification. No evidence of mitral valve regurgitation. Tricuspid Valve: The tricuspid valve is normal in structure. Tricuspid valve regurgitation is not demonstrated. Aortic Valve: The aortic valve was not well visualized. Aortic valve  regurgitation is not visualized. Pulmonic Valve: The pulmonic valve was normal in structure. Pulmonic valve regurgitation is not visualized. Aorta: The aortic root, ascending aorta and aortic arch are all structurally normal, with no evidence of dilitation or obstruction. IAS/Shunts: The atrial septum is grossly normal.  LEFT VENTRICLE PLAX 2D LVIDd:         4.90 cm LVIDs:         3.90 cm LV PW:         1.00  cm LV IVS:        0.90 cm LVOT diam:     1.70 cm LV SV:         39.84 ml LV SV Index:   19.70 LVOT Area:     2.27 cm  RIGHT VENTRICLE             IVC RV Basal diam:  2.50 cm     IVC diam: 2.00 cm RV Mid diam:    1.90 cm RV S prime:     12.40 cm/s TAPSE (M-mode): 2.0 cm LEFT ATRIUM             Index       RIGHT ATRIUM           Index LA diam:        4.60 cm 2.05 cm/m  RA Area:     14.30 cm LA Vol (A2C):   43.8 ml 19.49 ml/m RA Volume:   32.80 ml  14.60 ml/m LA Vol (A4C):   45.4 ml 20.20 ml/m LA Biplane Vol: 44.5 ml 19.80 ml/m  AORTIC VALVE LVOT Vmax:   93.75 cm/s LVOT Vmean:  67.350 cm/s LVOT VTI:    0.176 m  AORTA Ao Root diam: 3.00 cm Ao Asc diam:  2.80 cm MITRAL VALVE MV Area (PHT): 3.81 cm             SHUNTS MV Decel Time: 199 msec             Systemic VTI:  0.18 m MV E velocity: 132.50 cm/s 103 cm/s Systemic Diam: 1.70 cm Mertie Moores MD Electronically signed by Mertie Moores MD Signature Date/Time: 11/08/2019/2:26:37 PM    Final    VAS [place] LOWER EXTREMITY VENOUS (DVT)  Result Date: 11/08/2019  Lower Venous DVTStudy Indications: Swelling.  Risk Factors: None identified. Limitations: Body habitus and poor ultrasound/tissue interface. Comparison Study: No prior studies. Performing Technologist: Oliver Hum RVT  Examination Guidelines: A complete evaluation includes B-mode imaging, spectral Doppler, color Doppler, and power Doppler as needed of all accessible portions of each vessel. Bilateral testing is considered an integral part of a complete examination. Limited examinations for reoccurring  indications may be performed as noted. The reflux portion of the exam is performed with the patient in reverse Trendelenburg.  +---------+---------------+---------+-----------+----------+--------------+ RIGHT    CompressibilityPhasicitySpontaneityPropertiesThrombus Aging +---------+---------------+---------+-----------+----------+--------------+ CFV      Full           Yes      Yes                                 +---------+---------------+---------+-----------+----------+--------------+ SFJ      Full                                                        +---------+---------------+---------+-----------+----------+--------------+ FV Prox  Full                                                        +---------+---------------+---------+-----------+----------+--------------+ FV Mid   Full                                                        +---------+---------------+---------+-----------+----------+--------------+  FV DistalFull                                                        +---------+---------------+---------+-----------+----------+--------------+ PFV      Full                                                        +---------+---------------+---------+-----------+----------+--------------+ POP      Full           Yes      Yes                                 +---------+---------------+---------+-----------+----------+--------------+ PTV      Full                                                        +---------+---------------+---------+-----------+----------+--------------+ PERO     Full                                                        +---------+---------------+---------+-----------+----------+--------------+   +---------+---------------+---------+-----------+----------+--------------+ LEFT     CompressibilityPhasicitySpontaneityPropertiesThrombus Aging  +---------+---------------+---------+-----------+----------+--------------+ CFV      Full           Yes      Yes                                 +---------+---------------+---------+-----------+----------+--------------+ SFJ      Full                                                        +---------+---------------+---------+-----------+----------+--------------+ FV Prox  Full                                                        +---------+---------------+---------+-----------+----------+--------------+ FV Mid   Full                                                        +---------+---------------+---------+-----------+----------+--------------+ FV DistalFull                                                        +---------+---------------+---------+-----------+----------+--------------+   PFV      Full                                                        +---------+---------------+---------+-----------+----------+--------------+ POP      Full           Yes      Yes                                 +---------+---------------+---------+-----------+----------+--------------+ PTV      Full                                                        +---------+---------------+---------+-----------+----------+--------------+ PERO     Full                                                        +---------+---------------+---------+-----------+----------+--------------+     Summary: RIGHT: - There is no evidence of deep vein thrombosis in the lower extremity.  - No cystic structure found in the popliteal fossa.  LEFT: - There is no evidence of deep vein thrombosis in the lower extremity.  - No cystic structure found in the popliteal fossa.  *See table(s) above for measurements and observations. Electronically signed by Lemar Livings MD on 11/08/2019 at 6:38:12 PM.    Final        Discharge Exam: Vitals:   11/10/19 0241 11/10/19 0816  BP: 123/75   Pulse: 89     Resp: 16   Temp: 98.2 F (36.8 C)   SpO2: 96% 94%    GENERAL: No acute distress.  Appears well.  HEENT: MMM.  Vision and hearing grossly intact.  NECK: Supple.  No apparent JVD.  RESP: 92% on RA.  No IWOB. Good air movement bilaterally. CVS: Irregular rhythm.  Normal rate. Heart sounds normal.  ABD/GI/GU: Bowel sounds present. Soft. Non tender.  MSK/EXT:  Moves extremities. No apparent deformity or edema.  SKIN: no apparent skin lesion or wound NEURO: Awake, alert and oriented appropriately.  No apparent focal neuro deficit. PSYCH: Calm. Normal affect.   The results of significant diagnostics from this hospitalization (including imaging, microbiology, ancillary and laboratory) are listed below for reference.     Microbiology: Recent Results (from the past 240 hour(s))  Respiratory Panel by RT PCR (Flu A&B, Covid) - Nasopharyngeal Swab     Status: None   Collection Time: 11/08/19  1:00 AM   Specimen: Nasopharyngeal Swab  Result Value Ref Range Status   SARS Coronavirus 2 by RT PCR NEGATIVE NEGATIVE Final    Comment: (NOTE) SARS-CoV-2 target nucleic acids are NOT DETECTED. The SARS-CoV-2 RNA is generally detectable in upper respiratoy specimens during the acute phase of infection. The lowest concentration of SARS-CoV-2 viral copies this assay can detect is 131 copies/mL. A negative result does not preclude SARS-Cov-2 infection and should not be used as the sole basis for treatment or other patient management decisions. A  negative result may occur with  improper specimen collection/handling, submission of specimen other than nasopharyngeal swab, presence of viral mutation(s) within the areas targeted by this assay, and inadequate number of viral copies (<131 copies/mL). A negative result must be combined with clinical observations, patient history, and epidemiological information. The expected result is Negative. Fact Sheet for Patients:   https://www.moore.com/ Fact Sheet for Healthcare Providers:  https://www.young.biz/ This test is not yet ap proved or cleared by the [place] FDA and  has been authorized for detection and/or diagnosis of SARS-CoV-2 by FDA under an Emergency Use Authorization (EUA). This EUA will remain  in effect (meaning this test can be used) for the duration of the COVID-19 declaration under Section 564(b)(1) of the Act, 21 U.S.C. section 360bbb-3(b)(1), unless the authorization is terminated or revoked sooner.    Influenza A by PCR NEGATIVE NEGATIVE Final   Influenza B by PCR NEGATIVE NEGATIVE Final    Comment: (NOTE) The Xpert Xpress SARS-CoV-2/FLU/RSV assay is intended as an aid in  the diagnosis of influenza from Nasopharyngeal swab specimens and  should not be used as a sole basis for treatment. Nasal washings and  aspirates are unacceptable for Xpert Xpress SARS-CoV-2/FLU/RSV  testing. Fact Sheet for Patients: https://www.moore.com/ Fact Sheet for Healthcare Providers: https://www.young.biz/ This test is not yet approved or cleared by the [place] FDA and  has been authorized for detection and/or diagnosis of SARS-CoV-2 by  FDA under an Emergency Use Authorization (EUA). This EUA will remain  in effect (meaning this test can be used) for the duration of the  Covid-19 declaration under Section 564(b)(1) of the Act, 21  U.S.C. section 360bbb-3(b)(1), unless the authorization is  terminated or revoked. Performed at [facility], [address], [place], [place] [address]      Labs: BNP (last 3 results) Recent Labs    11/07/19 2320  BNP 81.1   Basic Metabolic Panel: Recent Labs  Lab 11/07/19 2320 11/08/19 0837 11/09/19 0131 11/10/19 0401  NA 137  --  138 139  K 4.4  --  5.1 4.1  CL 104  --  103 103  CO2 25  --  22 27  GLUCOSE 93  --  121* 113*  BUN 29*  --  38* 40*  CREATININE  1.22*  --  1.45* 1.45*  CALCIUM 8.7*  --  8.7* 8.7*  MG  --  2.0  --  2.0  PHOS  --   --   --  3.3   Liver Function Tests: Recent Labs  Lab 11/07/19 2320 11/10/19 0401  AST 18  --   ALT 15  --   ALKPHOS 49  --   BILITOT 0.8  --   PROT 7.0  --   ALBUMIN 3.5 3.0*   No results for input(s): LIPASE, AMYLASE in the last 168 hours. No results for input(s): AMMONIA in the last 168 hours. CBC: Recent Labs  Lab 11/07/19 2320 11/10/19 0401  WBC 7.9 10.9*  NEUTROABS 5.6  --   HGB 13.3 12.0  HCT 41.2 36.0  MCV 90.0 88.5  PLT 217 236   Cardiac Enzymes: No results for input(s): CKTOTAL, CKMB, CKMBINDEX, TROPONINI in the last 168 hours. BNP: Invalid input(s): POCBNP CBG: Recent Labs  Lab 11/08/19 2026 11/09/19 2247  GLUCAP 142* 108*   D-Dimer Recent Labs    11/07/19 2320  DDIMER 1.90*   Hgb A1c No results for input(s): HGBA1C in the last 72 hours. Lipid Profile No results for  input(s): CHOL, HDL, LDLCALC, TRIG, CHOLHDL, LDLDIRECT in the last 72 hours. Thyroid function studies Recent Labs    11/08/19 0837  TSH 3.268   Anemia work up No results for input(s): VITAMINB12, FOLATE, FERRITIN, TIBC, IRON, RETICCTPCT in the last 72 hours. Urinalysis No results found for: COLORURINE, APPEARANCEUR, LABSPEC, PHURINE, GLUCOSEU, HGBUR, BILIRUBINUR, KETONESUR, PROTEINUR, UROBILINOGEN, NITRITE, LEUKOCYTESUR Sepsis Labs Invalid input(s): PROCALCITONIN,  WBC,  LACTICIDVEN   Time coordinating discharge: 40 minutes  SIGNED:  Almon Hercules, MD  [facility] 11/10/2019, 10:01 AM  If 7PM-7AM, please contact night-coverage [email or web address] Password [email or web address]

## 2019-12-08 MED ORDER — ALBUTEROL SULFATE (2.5 MG/3ML) 0.083% IN NEBU
2.50 | INHALATION_SOLUTION | RESPIRATORY_TRACT | Status: DC
Start: ? — End: 2019-12-08

## 2019-12-08 MED ORDER — THERA-M PO TABS
1.00 | ORAL_TABLET | ORAL | Status: DC
Start: 2019-12-07 — End: 2019-12-08

## 2019-12-08 MED ORDER — CALCIUM CARBONATE 1250 (500 CA) MG PO TABS
1250.00 | ORAL_TABLET | ORAL | Status: DC
Start: 2019-12-07 — End: 2019-12-08

## 2019-12-08 MED ORDER — TRAMADOL HCL 50 MG PO TABS
50.00 | ORAL_TABLET | ORAL | Status: DC
Start: ? — End: 2019-12-08

## 2019-12-08 MED ORDER — NYSTATIN 100000 UNIT/GM EX POWD
CUTANEOUS | Status: DC
Start: 2019-12-06 — End: 2019-12-08

## 2019-12-08 MED ORDER — ACETAMINOPHEN 325 MG PO TABS
650.00 | ORAL_TABLET | ORAL | Status: DC
Start: ? — End: 2019-12-08

## 2019-12-08 MED ORDER — CLONIDINE HCL 0.1 MG PO TABS
0.10 | ORAL_TABLET | ORAL | Status: DC
Start: ? — End: 2019-12-08

## 2019-12-08 MED ORDER — PREDNISONE 20 MG PO TABS
40.00 | ORAL_TABLET | ORAL | Status: DC
Start: 2019-12-07 — End: 2019-12-08

## 2019-12-08 MED ORDER — CITALOPRAM HYDROBROMIDE 20 MG PO TABS
40.00 | ORAL_TABLET | ORAL | Status: DC
Start: 2019-12-07 — End: 2019-12-08

## 2019-12-08 MED ORDER — DEXTROSE 10 % IV SOLN
125.00 | INTRAVENOUS | Status: DC
Start: ? — End: 2019-12-08

## 2019-12-08 MED ORDER — CETIRIZINE HCL 10 MG PO TABS
10.00 | ORAL_TABLET | ORAL | Status: DC
Start: 2019-12-07 — End: 2019-12-08

## 2019-12-08 MED ORDER — MUPIROCIN 2 % EX OINT
TOPICAL_OINTMENT | CUTANEOUS | Status: DC
Start: 2019-12-07 — End: 2019-12-08

## 2019-12-08 MED ORDER — APIXABAN 5 MG PO TABS
5.00 | ORAL_TABLET | ORAL | Status: DC
Start: 2019-12-06 — End: 2019-12-08

## 2019-12-08 MED ORDER — FUROSEMIDE 40 MG PO TABS
40.00 | ORAL_TABLET | ORAL | Status: DC
Start: 2019-12-07 — End: 2019-12-08

## 2019-12-08 MED ORDER — ACETAMINOPHEN 500 MG PO TABS
1000.00 | ORAL_TABLET | ORAL | Status: DC
Start: ? — End: 2019-12-08

## 2019-12-08 MED ORDER — MELATONIN 3 MG PO TABS
10.00 | ORAL_TABLET | ORAL | Status: DC
Start: 2019-12-06 — End: 2019-12-08

## 2019-12-08 MED ORDER — GABAPENTIN 300 MG PO CAPS
300.00 | ORAL_CAPSULE | ORAL | Status: DC
Start: 2019-12-06 — End: 2019-12-08

## 2019-12-08 MED ORDER — DICLOFENAC SODIUM 1 % EX GEL
2.00 | CUTANEOUS | Status: DC
Start: 2019-12-06 — End: 2019-12-08

## 2019-12-08 MED ORDER — ALBUTEROL SULFATE HFA 108 (90 BASE) MCG/ACT IN AERS
2.00 | INHALATION_SPRAY | RESPIRATORY_TRACT | Status: DC
Start: ? — End: 2019-12-08

## 2019-12-08 MED ORDER — DILTIAZEM HCL ER BEADS 180 MG PO CP24
360.00 | ORAL_CAPSULE | ORAL | Status: DC
Start: 2019-12-07 — End: 2019-12-08

## 2019-12-08 MED ORDER — LEVOTHYROXINE SODIUM 75 MCG PO TABS
75.00 | ORAL_TABLET | ORAL | Status: DC
Start: 2019-12-07 — End: 2019-12-08

## 2019-12-08 MED ORDER — FLUTICASONE FUROATE-VILANTEROL 100-25 MCG/INH IN AEPB
1.00 | INHALATION_SPRAY | RESPIRATORY_TRACT | Status: DC
Start: 2019-12-07 — End: 2019-12-08

## 2019-12-08 MED ORDER — GLUCAGON (RDNA) 1 MG IJ KIT
1.00 | PACK | INTRAMUSCULAR | Status: DC
Start: ? — End: 2019-12-08

## 2019-12-08 MED ORDER — GLUCOSE 40 % PO GEL
15.00 | ORAL | Status: DC
Start: ? — End: 2019-12-08

## 2019-12-08 NOTE — Progress Notes (Deleted)
Cardiology Office Note:    Date:  12/08/2019   ID:  Angela Beasley, DOB 08-Jul-1930, MRN [id number]  PCP:  Guadlupe Spanish, MD  Cardiologist:  No primary care provider on file.  Electrophysiologist:  None   Referring MD: Guadlupe Spanish, MD   No chief complaint on file. ***  History of Present Illness:    Angela Beasley is a 86 y.o. female with a hx of COPD, hypertension, hypothyroidism, fibromyalgia, former tobacco use who presents as a hospital follow-up for newly diagnosed atrial fibrillation.  She was admitted to [facility] with acute hypoxic respiratory failure due to COPD exacerbation/pulmonary edema from 11/07/2019 through 11/10/2019, and was found to have atrial fibrillation.  She spontaneously converted to normal sinus rhythm.  TTE showed EF 50 to 55%.  She was discharged on Eliquis 5 mg twice daily and diltiazem CD 180 mg daily.  She was also found to have a splenic mass and mediastinal/hilar adenopathy, PET scan planned as outpatient.  Follow-up  Past Medical History:  Diagnosis Date  . Arthritis   . Bronchitis   . COPD (chronic obstructive pulmonary disease) ([place])   . Fibromyalgia   . Hypertension   . Hypothyroidism   . Neuropathy   . Shingles     Past Surgical History:  Procedure Laterality Date  . BACK SURGERY    . CESAREAN SECTION    . NECK SURGERY    . PARTIAL HYSTERECTOMY    . TONSILLECTOMY    . TOTAL HIP ARTHROPLASTY      Current Medications: No outpatient medications have been marked as taking for the 12/10/19 encounter (Appointment) with Donato Heinz, MD.     Allergies:   Lisinopril and Phenobarbital   Social History   Socioeconomic History  . Marital status: Divorced    Spouse name: Not on file  . Number of children: Not on file  . Years of education: Not on file  . Highest education level: Not on file  Occupational History  . Not on file  Tobacco Use  . Smoking status: Former Research scientist (life sciences)  . Smokeless tobacco: Never Used  Substance and Sexual  Activity  . Alcohol use: Never  . Drug use: Never  . Sexual activity: Not on file  Other Topics Concern  . Not on file  Social History Narrative  . Not on file   Social Determinants of Health   Financial Resource Strain:   . Difficulty of Paying Living Expenses:   Food Insecurity:   . Worried About Charity fundraiser in the Last Year:   . Arboriculturist in the Last Year:   Transportation Needs:   . Film/video editor (Medical):   Marland Kitchen Lack of Transportation (Non-Medical):   Physical Activity:   . Days of Exercise per Week:   . Minutes of Exercise per Session:   Stress:   . Feeling of Stress :   Social Connections:   . Frequency of Communication with Friends and Family:   . Frequency of Social Gatherings with Friends and Family:   . Attends Religious Services:   . Active Member of Clubs or Organizations:   . Attends Archivist Meetings:   Marland Kitchen Marital Status:      Family History: The patient's ***family history is not on file.  ROS:   Please see the history of present illness.    *** All other systems reviewed and are negative.  EKGs/Labs/Other Studies Reviewed:    The following studies were reviewed today: ***  EKG:  EKG is *** ordered today.  The ekg ordered today demonstrates ***  TTE 11/08/19: 1. Left ventricular ejection fraction, by estimation, is 50 to 55%. The  left ventricle has low normal function. The left ventrical has no regional  wall motion abnormalities. Left ventricular diastolic parameters are  consistent with Grade II diastolic  dysfunction (pseudonormalization).  2. Right ventricular systolic function is normal. The right ventricular  size is normal.  3. The mitral valve is grossly normal. no evidence of mitral valve  regurgitation.  4. The aortic valve was not well visualized. Aortic valve regurgitation  is not visualized.   LE duplex 11/08/19: RIGHT:  - There is no evidence of deep vein thrombosis in the lower extremity.      - No cystic structure found in the popliteal fossa.    LEFT:  - There is no evidence of deep vein thrombosis in the lower extremity.    - No cystic structure found in the popliteal fossa.   Recent Labs: 11/07/2019: ALT 15; B Natriuretic Peptide 81.1 11/08/2019: TSH 3.268 11/10/2019: BUN 40; Creatinine, Ser 1.45; Hemoglobin 12.0; Magnesium 2.0; Platelets 236; Potassium 4.1; Sodium 139  Recent Lipid Panel No results found for: CHOL, TRIG, HDL, CHOLHDL, VLDL, LDLCALC, LDLDIRECT  Physical Exam:    VS:  There were no vitals taken for this visit.    Wt Readings from Last 3 Encounters:  11/10/19 260 lb 9.3 oz (118.2 kg)     GEN: *** Well nourished, well developed in no acute distress HEENT: Normal NECK: No JVD; No carotid bruits LYMPHATICS: No lymphadenopathy CARDIAC: ***RRR, no murmurs, rubs, gallops RESPIRATORY:  Clear to auscultation without rales, wheezing or rhonchi  ABDOMEN: Soft, non-tender, non-distended MUSCULOSKELETAL:  No edema; No deformity  SKIN: Warm and dry NEUROLOGIC:  Alert and oriented x 3 PSYCHIATRIC:  Normal affect   ASSESSMENT:    No diagnosis found. PLAN:    In order of problems listed above:  Atrial fibrillation: Paroxysmal.  Diagnosed during hospitalization for COPD exacerbation.  CHA2DS2-VASc score 4  (hypertension, age x2, female).  TTE shows normal LV systolic function. -Continue Eliquis 5 mg twice daily -Continue diltiazem 180 mg daily  Hypertension: On diltiazem 180 mg daily  HFpEF: On as needed Lasix 40 mg daily  RTC in***   Medication Adjustments/Labs and Tests Ordered: Current medicines are reviewed at length with the patient today.  Concerns regarding medicines are outlined above.  No orders of the defined types were placed in this encounter.  No orders of the defined types were placed in this encounter.   There are no Patient Instructions on file for this visit.   Signed, Little Ishikawa, MD  12/08/2019 2:45 PM     [facility]

## 2019-12-10 ENCOUNTER — Ambulatory Visit: Payer: Medicare Other | Admitting: Cardiology

## 2019-12-18 ENCOUNTER — Encounter: Payer: Self-pay | Admitting: Cardiology

## 2020-03-27 DEATH — deceased

## 2021-07-16 IMAGING — CT CT ANGIO CHEST
2 of 8 series · 16 of 36 positions shown · IV contrast (omnipaque)
Comparison: Chest radiograph 11/07/2019

CLINICAL DATA: Shortness of breath, elevated D-dimer

EXAM:
CT ANGIOGRAPHY CHEST WITH CONTRAST
TECHNIQUE: Multidetector CT imaging of the chest was performed using the
standard protocol during bolus administration of intravenous
contrast. Multiplanar CT image reconstructions and MIPs were
obtained to evaluate the vascular anatomy.
CONTRAST:  80mL OMNIPAQUE IOHEXOL 350 MG/ML SOLN

[Series 6: pe coronal mpr · coronal · 0.60mm/px · 1 of 151 slices shown]
[im 76/151  mediastinal]
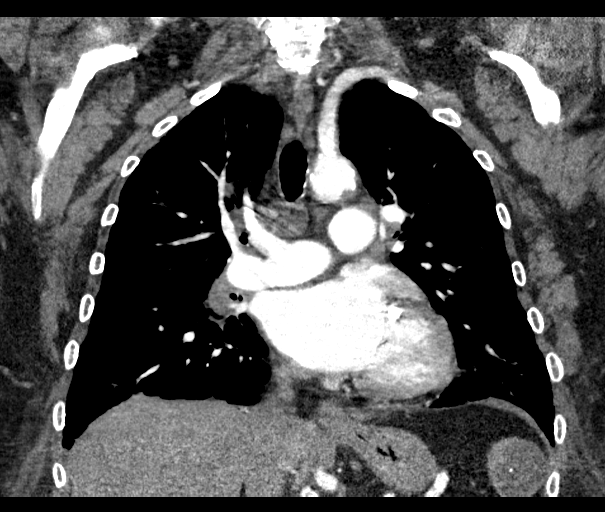

[Series 10: pe thins · axial · 0.81mm/px · z∈[-372,-116]mm · 15 of 288 slices shown]
[im 16/288  lung]
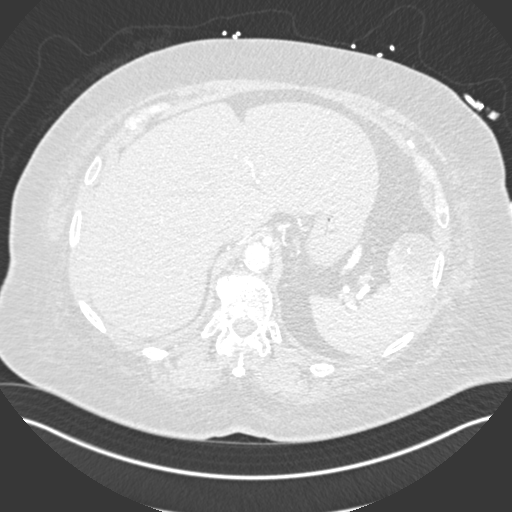
[im 31/288  mediastinal]
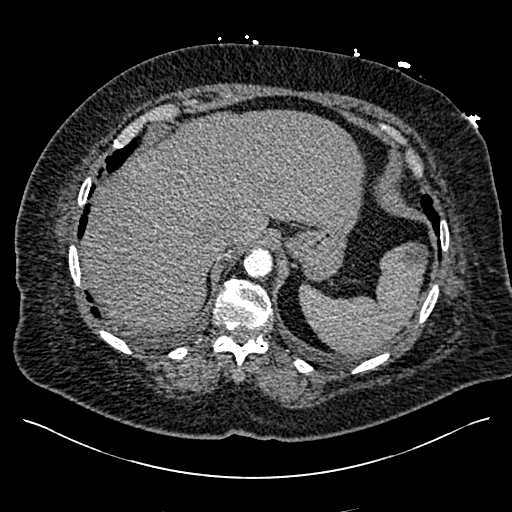
[im 61/288  lung]
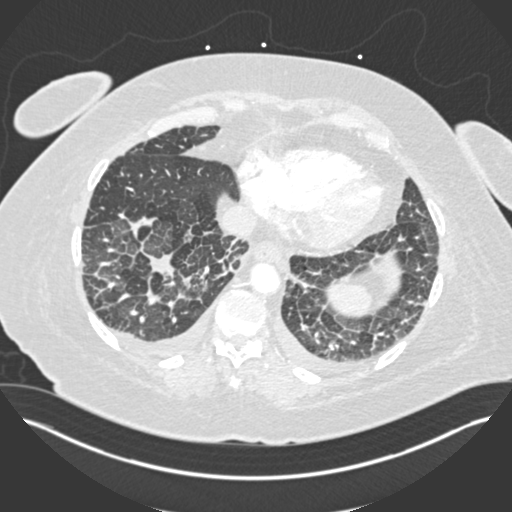
[im 76/288  mediastinal]
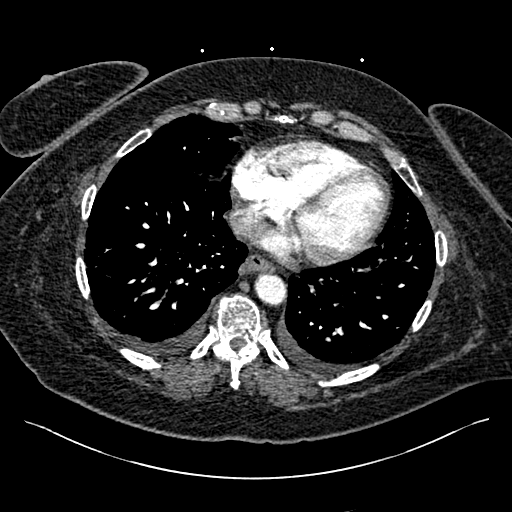
[im 91/288  lung]
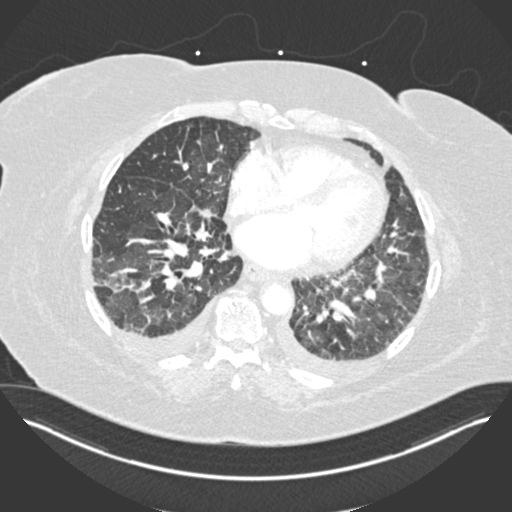
[im 106/288  mediastinal]
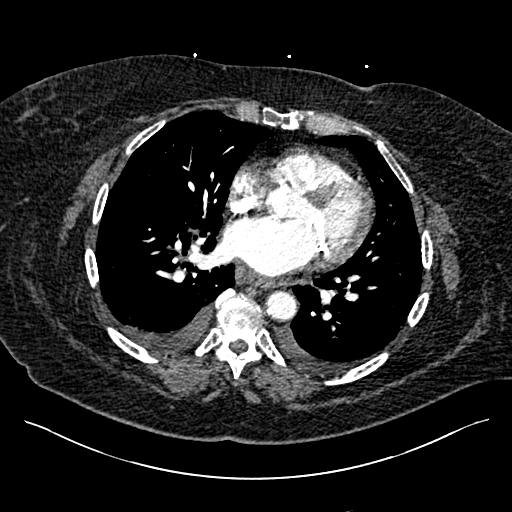
[im 121/288  lung]
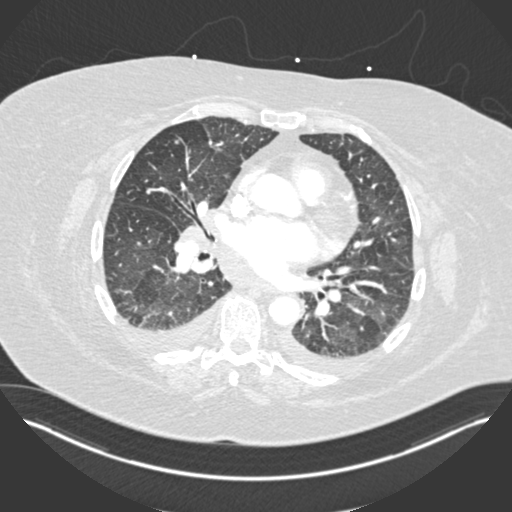
[im 152/288  mediastinal]
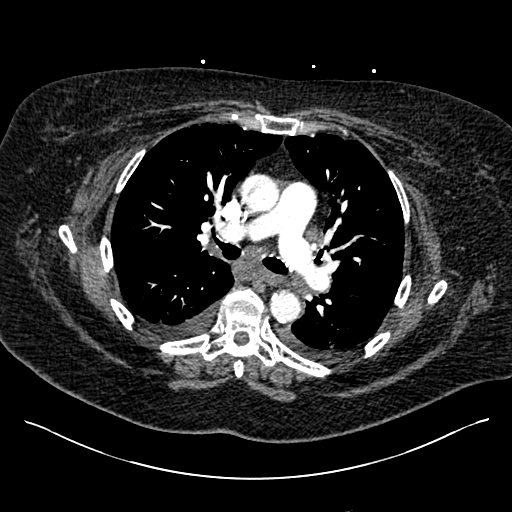
[im 167/288  lung]
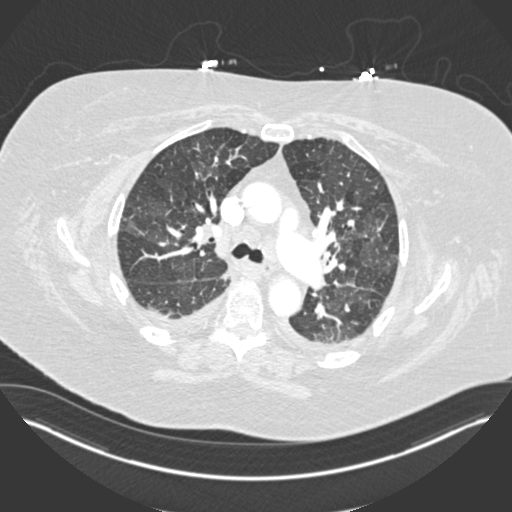
[im 182/288  mediastinal]
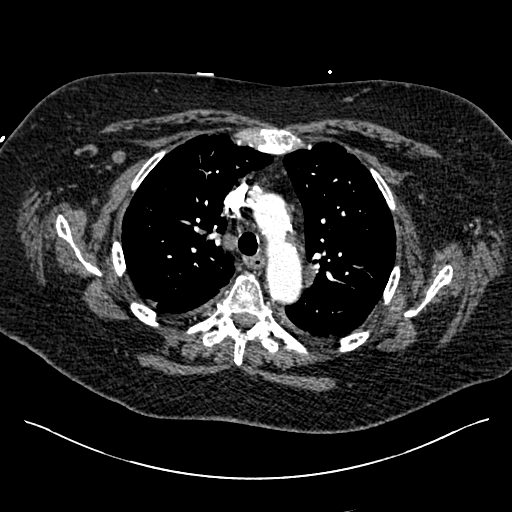
[im 197/288  lung]
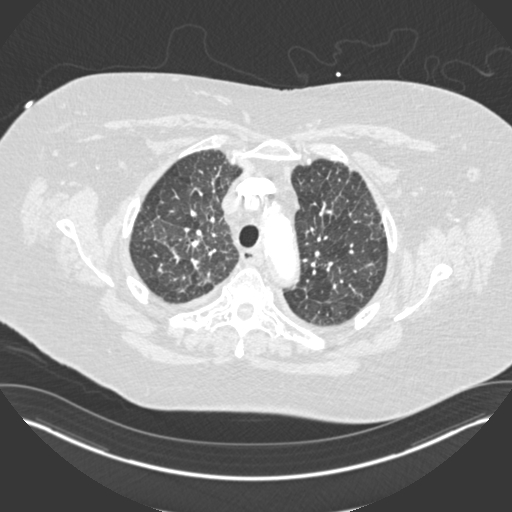
[im 212/288  mediastinal]
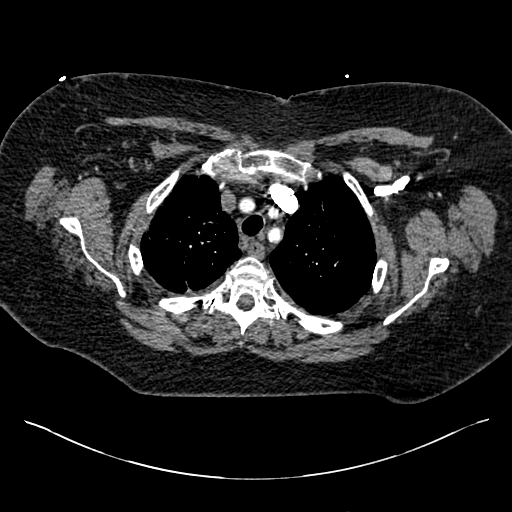
[im 242/288  lung]
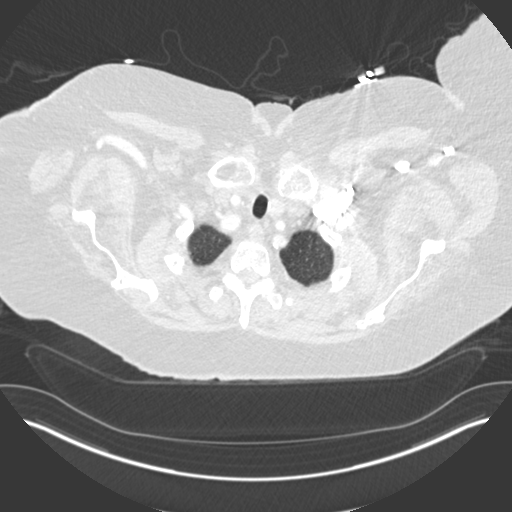
[im 257/288  mediastinal]
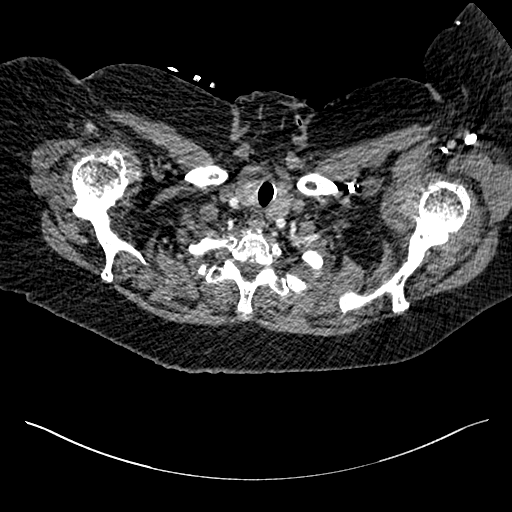
[im 272/288  lung]
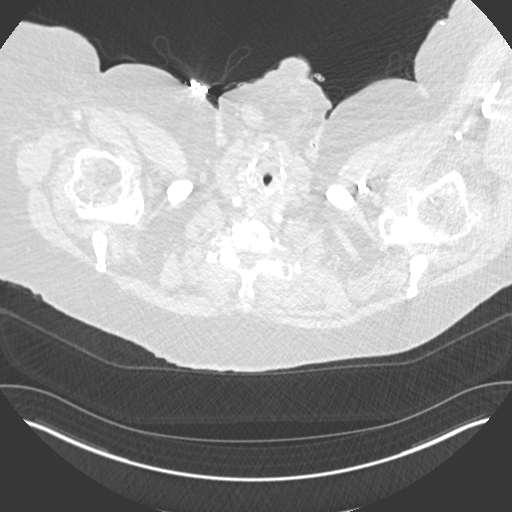

[16 of 36 positions shown; findings below may reference images not displayed]

FINDINGS: Cardiovascular: Satisfactory opacification the pulmonary arteries to
the segmental level. No pulmonary artery filling defects are
identified. Central pulmonary arteries are borderline enlarged but
this finding is favored to reflect chronic pulmonary artery
hypertension in the absence of filling defects. Normal cardiac size.
Coronary artery calcifications are present. Dense calcification of
the mitral annulus. No pericardial effusion. Normal physiologic
fluid in the pericardial recesses. Major venous structures are
unremarkable.

Mediastinum/Nodes: There is extensive mediastinal and hilar
adenopathy. A larger nodal conglomerate is seen in the subcarinal
space measuring up to 23 mm (4/52). Enlarged 12 mm right hilar node
(4/59) as well as several borderline enlarged AP window lymph nodes
including a 16 mm node (4/45). No acute abnormality of the trachea.
Mild nonspecific thickening of the thoracic esophagus.

Lungs/Pleura: There is extensive interlobular septal thickening with
some interspersed areas of mosaic attenuation and patchy
ground-glass. Few multifocal more consolidative airspace opacities
are noted as well. Largest focal consolidation seen in right lung
base (4/81) measuring up to 2 cm in size. Small bilateral effusions,
right slightly greater than left with adjacent passive atelectasis.

Upper Abdomen: Heterogeneous lesion seen in the spleen measuring up
to 3.8 cm in size. Some irregular internal calcification is noted.
Slight left lobe hypertrophy of the liver. Upper abdomen is
otherwise unremarkable. Circumferential body wall edema is noted.

Musculoskeletal: Multilevel degenerative changes are present in the
imaged portions of the spine. No suspicious chest wall lesions.

Review of the MIP images confirms the above findings.
IMPRESSION: 1. No evidence of acute pulmonary embolism.
2. Central pulmonary arteries are borderline enlarged but this
finding is favored to reflect chronic pulmonary artery hypertension
in the absence of filling defects.
3. Extensive interlobular septal thickening with some interspersed
areas of mosaic attenuation and patchy ground-glass. Few multifocal
more consolidative airspace opacities are noted as well. These
findings are favored to represent pulmonary edema, however there are
features suggesting superimposed infection as well with a more
consolidated 2 cm opacity in the right lung base. Consider 6-8 week
follow-up imaging to assess for resolution.
4. Small bilateral pleural effusions, right slightly greater than
left. Additional body wall edema may reflect early anasarca.
5. Extensive mediastinal and hilar adenopathy. Differential could
include either reactive or edematous nodes given setting of edema
and likely infection however given the splenic mass detailed below,
malignancy is not fully excluded. Could also be re-evaluated on
follow-up imaging.
6. Heterogeneous lesion in the spleen measuring up to 3.8 cm in size
with irregular internal calcification. This finding is nonspecific
and could represent a benign lesion such as hemangioma, however
other etiologies are not excluded. Consider further evaluation
dedicated splenic MRI with contrast on a nonemergent outpatient
basis for best imaging quality. This recommendation follows ACR
consensus guidelines: White Paper of the ACR Incidental Findings
Committee II on Splenic and Nodal Findings. [HOSPITAL]
7283;[DATE].
7. Mild nonspecific thickening of the thoracic esophagus. Correlate
for features of esophagitis.
8. Slight left lobe hypertrophy of the liver. Correlate with any
history of liver disease or risk factors for cirrhosis.

These results were called by telephone at the time of interpretation
on 11/08/2019 at [DATE] to provider SEFIYE TENBEL , who verbally
acknowledged these results.
